# Patient Record
Sex: Male | Born: 1960 | Race: White | Hispanic: Yes | Marital: Single | Smoking: Never smoker
Health system: Southern US, Community
[De-identification: ages and names within clinical notes are randomized; demographics above are authoritative.]

## PROBLEM LIST (undated history)

## (undated) DIAGNOSIS — I10 Essential (primary) hypertension: Secondary | ICD-10-CM

## (undated) DIAGNOSIS — R42 Dizziness and giddiness: Secondary | ICD-10-CM

---

## 2008-07-02 ENCOUNTER — Emergency Department (HOSPITAL_COMMUNITY): Admission: EM | Admit: 2008-07-02 | Discharge: 2008-07-03 | Payer: Self-pay | Admitting: Emergency Medicine

## 2010-08-11 LAB — COMPREHENSIVE METABOLIC PANEL
AST: 20 U/L (ref 0–37)
Albumin: 3.8 g/dL (ref 3.5–5.2)
CO2: 28 mEq/L (ref 19–32)
Calcium: 8.9 mg/dL (ref 8.4–10.5)
Creatinine, Ser: 1.07 mg/dL (ref 0.4–1.5)
GFR calc Af Amer: >60 mL/min (ref >60)
GFR calc non Af Amer: >60 mL/min (ref >60)

## 2010-08-11 LAB — CBC
MCHC: 35.5 g/dL (ref 30.0–36.0)
MCV: 86.8 fL (ref 78.0–100.0)
Platelets: 262 10*3/uL (ref 150–400)

## 2010-08-11 LAB — URINALYSIS, ROUTINE W REFLEX MICROSCOPIC
Bilirubin Urine: NEGATIVE
Hgb urine dipstick: NEGATIVE
Ketones, ur: NEGATIVE mg/dL
Protein, ur: NEGATIVE mg/dL
Urobilinogen, UA: 0.2 mg/dL (ref 0.0–1.0)

## 2010-08-11 LAB — DIFFERENTIAL
Eosinophils Relative: 5 % (ref 0–5)
Lymphocytes Relative: 40 % (ref 12–46)
Lymphs Abs: 3.2 10*3/uL (ref 0.7–4.0)

## 2010-08-11 LAB — LIPASE, BLOOD: Lipase: 40 U/L (ref 11–59)

## 2012-07-14 ENCOUNTER — Emergency Department (HOSPITAL_COMMUNITY): Payer: Self-pay

## 2012-07-14 ENCOUNTER — Emergency Department (HOSPITAL_COMMUNITY)
Admission: EM | Admit: 2012-07-14 | Discharge: 2012-07-14 | Disposition: A | Discharge status: Home / Self Care | Payer: Self-pay | Attending: Emergency Medicine | Admitting: Emergency Medicine

## 2012-07-14 ENCOUNTER — Encounter (HOSPITAL_COMMUNITY): Payer: Self-pay | Admitting: Family Medicine

## 2012-07-14 DIAGNOSIS — J329 Chronic sinusitis, unspecified: Secondary | ICD-10-CM | POA: Insufficient documentation

## 2012-07-14 DIAGNOSIS — M542 Cervicalgia: Secondary | ICD-10-CM | POA: Insufficient documentation

## 2012-07-14 DIAGNOSIS — R059 Cough, unspecified: Secondary | ICD-10-CM | POA: Insufficient documentation

## 2012-07-14 DIAGNOSIS — J069 Acute upper respiratory infection, unspecified: Secondary | ICD-10-CM | POA: Insufficient documentation

## 2012-07-14 DIAGNOSIS — R51 Headache: Secondary | ICD-10-CM | POA: Insufficient documentation

## 2012-07-14 LAB — URINALYSIS, ROUTINE W REFLEX MICROSCOPIC
Bilirubin Urine: NEGATIVE
Glucose, UA: NEGATIVE mg/dL
Ketones, ur: 15 mg/dL — AB
Leukocytes, UA: NEGATIVE
Nitrite: NEGATIVE
Protein, ur: 30 mg/dL — AB
Specific Gravity, Urine: 1.027 (ref 1.005–1.030)
Urobilinogen, UA: 1 mg/dL (ref 0.0–1.0)
pH: 7 (ref 5.0–8.0)

## 2012-07-14 LAB — RAPID STREP SCREEN (MED CTR MEBANE ONLY): Streptococcus, Group A Screen (Direct): NEGATIVE

## 2012-07-14 LAB — URINE MICROSCOPIC-ADD ON

## 2012-07-14 MED ORDER — ACETAMINOPHEN 325 MG PO TABS
650.0000 mg | ORAL_TABLET | Freq: Four times a day (QID) | ORAL | Status: DC | PRN
  Administered 2012-07-14: 650 mg via ORAL
  Filled 2012-07-14: qty 2

## 2012-07-14 NOTE — ED Notes (Signed)
Per pt translator sts HA, fever since Friday. Denies N,V,D. sts also sore throat. Pt coughing at triage.

## 2012-07-14 NOTE — Discharge Instructions (Signed)
Gotas Nasales Salinas (Saline Nose Drops) Para ayudarlo a descongestionar la nariz, coloque gotas nasales de agua salada (solucin salina) en la nariz del beb. Esto ayuda a Interior and spatial designer. Utilice una jeringa de pera para limpiar la nariz:  Antes de alimentar al beb.  Antes de acostarlo para que tome una siesta.  No lo haga ms de una vez cada 3 horas para evitar que se irriten las fosas nasales. CUIDADOS EN EL HOGAR  Compre las gotas nasales en su farmacia local. Tambin puede hacer las gotas usted mismo. Mezcle 1 taza de agua con  cucharadita de sal. Revuelva. Almacene la mezcla a Publishing rights manager. Haga una nueva mezcla a diario.  Para utilizar las gotas:  Coloque 1 o 2 gotas en cada lado de la nariz del beb con un gotero medicinal limpio. No utilice este gotero para otros medicamentos.  Exprima el aire de la [place] de pera antes de introducirla en la nariz del beb.  Mientras mantiene la pera apretada, coloque la punta en una fosa nasal. Deje que el aire vuelva a entrar en la pera. La succin extraer el moco de la Clinical cytogeneticist e ir hacia la [place] de pera.  Repita en la otra fosa nasal.  Apriete la pera para extraer el contenido en un pauelo de papel y lave la punta en agua con jabn. Guarde la [place] de pera con el lado de la punta apoyado en papel absorbente.  Utilice la jeringa de pera slo con las gotas nasales para evitar que se irriten las fosas nasales del beb. SOLICITE AYUDA DE INMEDIATO SI:  El moco cambia a un color verde o amarillo.  El moco se vuelve ms espeso.  El beb tiene 3 meses o menos y su temperatura rectal es de 100.4 F (38 C) o mayor.  Su beb tiene ms de 3 meses y su temperatura rectal es de 102 F (38.9 C) o mayor.  La congestin nasal dura 10 das o ms.  El beb tiene problemas para respirar o para alimentarse. ASEGRESE DE QUE:  Comprende estas instrucciones.  Controlar su enfermedad.  Solicitar ayuda de inmediato si  el beb no est bien, o si empeora. Document Released: 05/20/2010 Document Revised: 07/10/2011 [facility] Patient Information 2013 [place], [place].  Infecciones virales (Viral Infections) La causa de las infecciones virales son diferentes tipos de virus.La mayora de las infecciones virales no son graves y se curan solas. Sin embargo, algunas infecciones pueden provocar sntomas graves y causar complicaciones.  SNTOMAS Las infecciones virales ocasionan:   Dolores de Advertising copywriter.  Molestias.  Dolor de [place].  Mucosidad nasal.  Diferentes tipos de erupcin.  Lagrimeo.  Cansancio.  Tos.  Prdida del apetito.  Infecciones gastrointestinales que producen nuseas, vmitos y [place]. Estos sntomas no responden a los antibiticos porque la infeccin no es por bacterias. Sin embargo, puede sufrir una infeccin bacteriana luego de la infeccin viral. Se denomina sobreinfeccin. Los sntomas de esta infeccin bacteriana son:   [employer] dolor en la garganta con pus y dificultad para tragar.  Ganglios hinchados en el cuello.  Escalofros y fiebre muy elevada o persistente.  Dolor de cabeza intenso.  Sensibilidad en los senos paranasales.  Malestar (sentirse enfermo) general persistente, dolores musculares y fatiga (cansancio).  Tos persistente.  Produccin mucosa con la tos, de color amarillo, verde o marrn. INSTRUCCIONES PARA EL CUIDADO DOMICILIARIO  Solo tome medicamentos que se pueden comprar sin receta o recetados para Chief Technology Officer, Dentist, la diarrea o la fiebre, como le [place]  mdico.  Beba gran cantidad de lquido para mantener la orina de tono claro o color amarillo plido. Las bebidas deportivas proporcionan electrolitos,azcares e hidratacin.  Descanse lo suficiente y [employer]. Puede tomar sopas y caldos con crackers o arroz. SOLICITE ATENCIN MDICA DE INMEDIATO SI:  Tiene dolor de cabeza, le falta el aire, siente dolor en el pecho, en el cuello o  aparece una erupcin.  Tiene vmitos o diarrea intensos y no puede retener lquidos.  Usted o su nio tienen una temperatura oral de ms de 102 F (38.9 C) y no puede controlarla con medicamentos.  Su beb tiene ms de 3 meses y su temperatura rectal es de 102 F (38.9 C) o ms.  Su beb tiene 3 meses o menos y su temperatura rectal es de 100.4 F (38 C) o ms. EST SEGURO QUE:   Comprende las instrucciones para el alta mdica.  Controlar su enfermedad.  Solicitar atencin mdica de inmediato segn las indicaciones. Document Released: 01/25/2005 Document Revised: 07/10/2011 [facility] Patient Information 2013 [place], [place].  Infeccin de las vas areas superiores en los adultos (Upper Respiratory Infection, Adult)  La infeccin respiratoria de las vas areas superiores se conoce tambin como resfro comn. Las vas areas superiores [employer] senos nasales, la garganta, la trquea, y los bronquios. Los bronquios son las vas areas que conducen el aire a los pulmones. La mayor parte de las personas mejora luego de una [place], Biomedical engineer los sntomas pueden durar [employer]. La tos residual puede durar ms. CAUSAS Varios tipos de virus pueden causar la infeccin de los tejidos que cubren las vas areas superiores. Los tejidos se irritan y se inflaman y se originan secreciones. Tambin es frecuente la produccin de moco. El resfro es contagioso. El virus se disemina fcilmente a otras personas por contacto oral. Aqu se incluyen los besos, el compartir un vaso y el toser o Engineering geologist. Tambin puede diseminarse tocndose la boca o la [place] y luego tocando una superficie que luego tocan Economist.  SNTOMAS Los sntomas se desarrollan entre uno y [place] luego de Cytogeneticist en contacto con el virus. Pueden variar de Neomia Dear persona a otra. Incluyen:  Secrecin nasal.  Estornudos  Congestin nasal.  Irritacin de los senos nasales.  Dolor de Advertising copywriter.  Prdida de la voz  (laringitis).  Tos.  Fatiga.  Dolores musculares.  Prdida del apetito.  Dolor de [place].  Fiebre no muy elevada. DIAGNSTICO Puede diagnosticarse a s mismo la infeccin respiratoria, segn los sntomas habituales, ya que la mayor parte de las personas se resfra dos o tres veces al ao. El profesional puede confirmarlo basndose en el examen fsico. Lo ms importante es que el profesional verifique que los sntomas no se deben a otra enfermedad como anginas, sinusitis, neumona, asma o epiglotitis. Para diagnosticar el resfrio comn, no es necesario que haga anlisis de [place], pruebas en la garganta o radiografas, pero en algunos casos puede ser de utilidad para excluir otros problemas ms graves. El mdico decidir si necesita otras pruebas. RIESGOS Y COMPLICACIONES Tendr mayor riesgo de sufrir un resfro grave si consume cigarrillos, sufre una enfermedad cardaca (como insuficiencia cardaca) o pulmonar crnica (como asma) o si tiene un debilitamiento del sistema inmunolgico. Las personas muy jvenes o muy mayores tienen riesgo de sufrir infecciones ms graves. La sinusitis bacteriana, las infecciones del odo medio y la neumona bacteriana pueden complicar el resfro comn. El resfro puede exacerbar el asma y la enfermedad pulmonar obstructiva crnica. En algunos casos estas  complicaciones requieren la atencin en un servicio de emergencias y pueden poner en peligro la vida. PREVENCIN La mejor manera de protegerse para no contraer un resfro es Pharmacologist una buena higiene. Evite el contacto bucal o de las manos con personas con sntomas de resfro. Si se produce el contacto, lvese las manos con frecuencia. No hay pruebas firmes que indiquen que la vitamina C, la vitamina E, la equincea o la actividad fsica reduzcan las posibilidades de tener una infeccin. Sin embargo, siempre se recomienda Insurance account manager y Winferd Humphrey buena nutricin. TRATAMIENTO El tratamiento est dirigido a  Consulting civil engineer sntomas. Esta enfermedad no tiene [place]. Los antibiticos no son eficaces, ya que esta infeccin la causa un virus y no una bacteria. El tratamiento incluye:  Aumente la ingesta de lquidos. Consumo de bebidas deportivas, que proporcionan electrolitos,azcares e hidratacin.  Inhale vapor caliente (de un vaporizador o de la ducha).  Tomar sopa de pollo u otros lquidos claros, y [employer] buena nutricin.  Descanse lo suficiente.  Haga grgaras o coma pastillas para [employer].  Control de la fiebre con ibuprofeno o acetaminofen, segn las indicaciones del mdico.  Aumento del uso del inhalador, si sufre asma. Las pastillas y los geles de zinc durante las primeras 24 horas de iniciado el resfro comn, pueden disminuir la duracin y Paramedic la gravedad de los sntomas. Los medicamentos para Chief Technology Officer pueden disminuir la fiebre, Paramedic los dolores musculares y Chief Technology Officer de Advertising copywriter. Se dispone de una gran variedad de medicamentos de venta libre para tratar la congestin y la secrecin nasal. El profesional podr recomendarle inhalantes para los otros sntomas. INSTRUCCIONES PARA EL CUIDADO DOMICILIARIO  Utilice los medicamentos de venta libre o de prescripcin para Chief Technology Officer, el malestar o la [place], segn se lo indique el profesional que lo asiste.  Utilice un vaporizador caliente o inhale vapor, haciendo salir agua de la ducha para aumentar la humedad [place]. Esto mantendr las secreciones hmedas y Community education officer ms fcil respirar.  Beba gran cantidad de lquido para mantener la orina de tono claro o color amarillo plido.  Descanse todo lo que pueda.  Regrese a su trabajo cuando la temperatura se haya normalizado, o cuando el profesional que lo asiste se lo indique. Quizs sea necesario que permanezca en su casa durante un tiempo ms prolongado para Buyer, retail a Economist. Tambin puede utilizar un barbijo y ser cuidadoso con el lavado de manos para  evitar la diseminacin del virus. SOLICITE ATENCIN MDICA SI:  Luego de los primeros das siente que empeora en vez de [place].  Necesita que Occupational psychologist brinde ms informacin relacionada con los medicamentos para [employer] sntomas.  Siente escalofros, le falta el aire o escupe moco de color marrn o rojo. Estos pueden ser sntomas de neumona.  Tiene una secrecin nasal de color amarillo o marrn, o siente dolor en el rostro, especialmente cuando se inclina hacia adelante. Estos pueden ser sntomas de sinusitis.  Tiene fiebre, siente el cuello hinchado, tiene dolor al tragar u observa manchas blancas en el fondo de la garganta. Estos pueden ser sntomas de angina por estreptococo. Romona Curls ATENCIN MDICA DE INMEDIATO SI:  Lance Muss.  Comienza a sentir Herbalist de cabeza intenso o persistente, dolor de odos, en el seno nasal o en el pecho.  Tiene tos y esta se prolonga demasiado, tose y escupe sangre, la mucosidad habitual se modifica (si tiene una enfermedad pulmonar crnica) o respira con dificultad.  Siente rigidez [employer]  cuello o dolor de cabeza intenso. Document Released: 01/25/2005 Document Revised: 07/10/2011 [facility] Patient Information 2013 [place], [place].  Sinusitis  (Sinusitis) La sinusitis es el enrojecimiento, dolor e hinchazn (inflamacin) de los senos paranasales. Los senos paranasales son bolsas de aire que se encuentran dentro de los [employer] del rostro (por debajo de los ojos, en la mitad de la frente o por encima de los ojos). En los senos paranasales sanos, el moco es capaz de Sales executive y el aire circula a travs de ellos en su camino hacia la Clinical cytogeneticist. Sin embargo, cuando se [place], el moco y el aire [place]. Esto hace que se desarrollen bacterias y otros grmenes y originen una infeccin.   La sinusitis puede desarrollarse rpidamente y durar slo un tiempo corto (aguda) o continuar por un perodo largo (crnica).. La sinusitis que dura ms de  12 semanas se considera crnica.  CAUSAS  Las causas de la sinusitis son:   [place].  Las [employer], como el desplazamiento del cartlago que separa las fosas nasales (desvo del tabique) pueden disminuir el flujo de aire por la nariz y los senos paranasales y Audiological scientist su drenaje.  Las alteraciones funcionales, como cuando los pequeos pelos (cilias) que se encuentran en los senos nasales y ayudan a eliminar la mucosidad no funcionan correctamente o no estn presentes. SNTOMAS  Los sntomas de sinusitis aguda y crnica son los mismos. Los sntomas principales son Chief Technology Officer y la presin alrededor de los senos paranasales afectados. Otros sntomas son:   Careers information officer.  Dolor de odos  Dolor de [place].  Mal aliento.  Disminucin del sentido del olfato y del gusto.  Tos, que empeora al [employer]  Fatiga.  Grant Ruts.  Drenaje de moco espeso por la nariz, que generalmente es de color verde y puede contener pus (purulento).  Hinchazn y calor en los senos paranasales afectados. DIAGNSTICO  El mdico le har un examen fsico. Durante el examen, el mdico:   Revisar su nariz buscando signos de crecimientos anormales en las fosas nasales (plipos nasales).  Palpar los senos paranasales afectados para buscar signos de infeccin.  Observar el interior de los senos paranasales (endoscopa) con un dispositivo luminoso especial (endoscopio) con el que tomar imgenes insertndolo en los senos paranasales. Si el mdico sospecha que usted sufre sinusitis crnica, podr indicar una o ms de las siguientes pruebas:   Pruebas de Programmer, multimedia.  Cultivo de secreciones nasales: tomar Lauris Poag del moco nasal y lo enviar a un laboratorio para detectar bacterias.  Citologa nasal: el mdico tomar [place] de moco de la nariz para determinar si la sinusitis que usted sufre est relacionada con Vella Raring. TRATAMIENTO  La mayora de los casos de sinusitis  aguda se deben a una infeccin viral y se resuelven espontneamente dentro de los [address]. En algunos casos se recetan medicamentos para [employer] (analgsicos, descongestivos, aerosoles nasales con corticoides o aerosoles salinos).  Sin embargo, para la sinusitis por infeccin bacteriana, Chief Operating Officer. Los antibiticos son medicamentos que destruyen las bacterias que causan la infeccin.  Rara vez la sinusitis tiene su origen en una infeccin por hongos. . En estos casos, el mdico le recetar un medicamento antifngico.  Para algunos casos de sinusitis crnica, es necesario someterse a [place]. Generalmente se trata de [employer] la sinusitis se repite ms de 3 veces al ao, a pesar de otros tratamientos.  INSTRUCCIONES PARA EL CUIDADO EN EL HOGAR   Beba gran  cantidad de lquidos. Los lquidos ayudan a Optometrist moco para que drene ms fcilmente de los senos paranasales.  Use un humidificador.  Inhale vapor de 3 a 4 veces al da (por ejemplo, sintese en el bao con la ducha abierta).  Aplique un pao tibio y hmedo en el rostro 3  4 veces al da, o segn las indicaciones de su mdico.  Use un aerosol nasal salino para ayudar a Environmental education officer y [employer] senos nasales.  Tome medicamentos de venta libre o recetados para Acupuncturist, Environmental health practitioner o la fiebre slo segn las indicaciones de su mdico. SOLICITE ATENCIN MDICA DE INMEDIATO SI:   Siente ms dolor o sufre dolores de cabeza intensos.  Tiene nuseas, vmitos o somnolencia.  Observa hinchazn alrededor del rostro.  Tiene problemas de visin.  Presenta rigidez en el cuello.  Tiene dificultad para respirar. ASEGRESE DE QUE:   Comprende estas instrucciones.  Controlar su enfermedad.  Solicitar ayuda de inmediato si no mejora o si empeora. Document Released: 01/25/2005 Document Revised: 07/10/2011 [facility] Patient Information 2013 [place], [place].

## 2012-07-14 NOTE — ED Notes (Signed)
Was able to obtain strep screen, patient did not tolerate well.

## 2012-07-14 NOTE — ED Provider Notes (Signed)
History     CSN: [id number]  Arrival date & time 07/14/12  1503   First MD Initiated Contact with Patient 07/14/12 1539      Chief Complaint  Patient presents with  . Fever    (Consider location/radiation/quality/duration/timing/severity/associated sxs/prior treatment) HPI Comments: 52 year old Spanish-speaking male presents to the emergency department complaining of sore throat, fever and headache x3 days.Tmax 104 yesterday. He took Advil which brought the fever and made his symptoms subsided. Admits to associated arthralgias and productive cough with green mucus. Headache located around his whole head described as throbbing. Admits to neck pain with coughing and a feeling of fullness. Denies chest pain, shortness of breath, nausea, vomiting, urinary changes, diarrhea or any sick contacts.  Patient is a 52 y.o. male presenting with fever. The history is provided by the patient. The history is limited by a language barrier. A language interpreter was used (phone interpreter).  Fever Associated symptoms: cough and headaches   Associated symptoms: no chest pain, no confusion, no dysuria, no nausea, no rash and no vomiting     History reviewed. No pertinent past medical history.  History reviewed. No pertinent past surgical history.  History reviewed. No pertinent family history.  History  Substance Use Topics  . Smoking status: Never Smoker   . Smokeless tobacco: Not on file  . Alcohol Use: No      Review of Systems  Constitutional: Positive for fever. Negative for appetite change.  HENT: Positive for neck pain. Negative for neck stiffness.   Respiratory: Positive for cough. Negative for shortness of breath.   Cardiovascular: Negative for chest pain.  Gastrointestinal: Negative for nausea and vomiting.  Genitourinary: Negative for dysuria, urgency and difficulty urinating.  Musculoskeletal: Negative for back pain.  Skin: Negative for rash.  Neurological: Positive for  headaches. Negative for dizziness, weakness and light-headedness.  Psychiatric/Behavioral: Negative for confusion.  All other systems reviewed and are negative.    Allergies  Review of patient's allergies indicates no known allergies.  Home Medications   Current Outpatient Rx  Name  Route  Sig  Dispense  Refill  . ibuprofen (ADVIL,MOTRIN) 200 MG tablet   Oral   Take 400 mg by mouth daily as needed for pain.           BP 157/100  Pulse 84  Temp(Src) 101.7 F (38.7 C)  Resp 18  SpO2 100%  Physical Exam  Nursing note and vitals reviewed. Constitutional: He is oriented to person, place, and time. He appears well-developed and well-nourished. No distress.  HENT:  Head: Normocephalic and atraumatic.  Nose: Mucosal edema present. Right sinus exhibits maxillary sinus tenderness and frontal sinus tenderness. Left sinus exhibits maxillary sinus tenderness and frontal sinus tenderness.  Mouth/Throat: Uvula is midline and mucous membranes are normal. Posterior oropharyngeal erythema present. No oropharyngeal exudate.  Post nasal drip present.  Eyes: Conjunctivae and EOM are normal. Pupils are equal, round, and reactive to light.  Neck: Normal range of motion. Neck supple. No Brudzinski's sign and no Kernig's sign noted.  Cardiovascular: Normal rate, regular rhythm, normal heart sounds and intact distal pulses.   Pulmonary/Chest: Effort normal and breath sounds normal. No respiratory distress. He has no decreased breath sounds. He has no wheezes. He has no rhonchi. He has no rales.  Abdominal: Soft. Bowel sounds are normal. There is no tenderness.  Musculoskeletal: Normal range of motion. He exhibits no edema.  Lymphadenopathy:    He has cervical adenopathy.  Neurological: He is alert and oriented to  person, place, and time.  Skin: Skin is warm. No rash noted.  Clammy  Psychiatric: He has a normal mood and affect. His behavior is normal.    ED Course  Procedures (including  critical care time)  Labs Reviewed  URINALYSIS, ROUTINE W REFLEX MICROSCOPIC - Abnormal; Notable for the following:    Hgb urine dipstick MODERATE (*)    Ketones, ur 15 (*)    Protein, ur 30 (*)    All other components within normal limits  RAPID STREP SCREEN  URINE CULTURE  URINE MICROSCOPIC-ADD ON   Dg Chest 2 View  07/14/2012  *RADIOLOGY REPORT*  Clinical Data: Nonproductive cough.  Bodyaches.  Sore throat.  CHEST - 2 VIEW  Comparison:  07/03/2008  Findings:  The heart size and mediastinal contours are within normal limits.  Both lungs are clear.  The visualized skeletal structures are unremarkable.  IMPRESSION: No active cardiopulmonary disease.   Original Report Authenticated By: Myles Rosenthal, M.D.      1. Viral URI   2. Sinusitis       MDM  52 year old male with viral URI and sinusitis. Chest x-ray clear. Rapid strep negative. Urine clear. He had negative meningeal signs. He is in no apparent distress. He'll be discharged with instructions to use nasal saline, Tylenol and Motrin, rest and increased fluids. This was discussed with patient via interpreter phone and patient states his understanding. Case discussed with Dr. Fonnie Jarvis who also evaluated patient and agrees with plan of care.        Trevor Mace, PA-C 07/14/12 1722

## 2012-07-14 NOTE — ED Provider Notes (Signed)
Medical screening examination/treatment/procedure(s) were conducted as a shared visit with non-physician practitioner(s) and myself.  I personally evaluated the patient during the encounter.  Lungs clear to auscultation unlabored, oropharynx noninjected, no stridor no drooling nares are patent, neck is supple, I doubt serious bacterial illness.  Hurman Horn, MD 07/15/12 1640

## 2012-07-14 NOTE — ED Notes (Signed)
Attempted to swab pt throat and unable to tolerate.

## 2012-07-16 LAB — URINE CULTURE

## 2013-08-06 ENCOUNTER — Ambulatory Visit
Admission: RE | Admit: 2013-08-06 | Discharge: 2013-08-06 | Disposition: A | Discharge status: Home / Self Care | Payer: PRIVATE HEALTH INSURANCE | Source: Ambulatory Visit | Attending: General Practice | Admitting: General Practice

## 2013-08-06 ENCOUNTER — Other Ambulatory Visit: Payer: Self-pay | Admitting: General Practice

## 2013-08-06 DIAGNOSIS — R42 Dizziness and giddiness: Secondary | ICD-10-CM

## 2013-12-02 ENCOUNTER — Observation Stay (HOSPITAL_COMMUNITY)
Admission: EM | Admit: 2013-12-02 | Discharge: 2013-12-03 | Disposition: A | Discharge status: Home / Self Care | Payer: PRIVATE HEALTH INSURANCE | Attending: Internal Medicine | Admitting: Internal Medicine

## 2013-12-02 ENCOUNTER — Emergency Department (HOSPITAL_COMMUNITY): Payer: PRIVATE HEALTH INSURANCE

## 2013-12-02 ENCOUNTER — Observation Stay (HOSPITAL_COMMUNITY): Payer: PRIVATE HEALTH INSURANCE

## 2013-12-02 ENCOUNTER — Encounter (HOSPITAL_COMMUNITY): Payer: Self-pay | Admitting: Emergency Medicine

## 2013-12-02 DIAGNOSIS — G454 Transient global amnesia: Secondary | ICD-10-CM | POA: Diagnosis not present

## 2013-12-02 DIAGNOSIS — I10 Essential (primary) hypertension: Secondary | ICD-10-CM | POA: Diagnosis present

## 2013-12-02 DIAGNOSIS — Z79899 Other long term (current) drug therapy: Secondary | ICD-10-CM | POA: Insufficient documentation

## 2013-12-02 DIAGNOSIS — Z7982 Long term (current) use of aspirin: Secondary | ICD-10-CM | POA: Diagnosis not present

## 2013-12-02 DIAGNOSIS — F29 Unspecified psychosis not due to a substance or known physiological condition: Secondary | ICD-10-CM | POA: Diagnosis present

## 2013-12-02 HISTORY — DX: Essential (primary) hypertension: I10

## 2013-12-02 LAB — COMPREHENSIVE METABOLIC PANEL
ALT: 28 U/L (ref 0–53)
AST: 25 U/L (ref 0–37)
Albumin: 4.3 g/dL (ref 3.5–5.2)
Alkaline Phosphatase: 76 U/L (ref 39–117)
Anion gap: 13 (ref 5–15)
BUN: 22 mg/dL (ref 6–23)
CALCIUM: 9.3 mg/dL (ref 8.4–10.5)
CHLORIDE: 101 meq/L (ref 96–112)
CO2: 24 mEq/L (ref 19–32)
Creatinine, Ser: 1.08 mg/dL (ref 0.50–1.35)
GFR calc Af Amer: 89 mL/min — ABNORMAL LOW (ref >90)
GFR, EST NON AFRICAN AMERICAN: 77 mL/min — AB (ref >90)
Glucose, Bld: 106 mg/dL — ABNORMAL HIGH (ref 70–99)
Potassium: 3.5 mEq/L — ABNORMAL LOW (ref 3.7–5.3)
SODIUM: 138 meq/L (ref 137–147)
Total Bilirubin: 0.3 mg/dL (ref 0.3–1.2)
Total Protein: 8.1 g/dL (ref 6.0–8.3)

## 2013-12-02 LAB — BASIC METABOLIC PANEL
Anion gap: 12 (ref 5–15)
BUN: 20 mg/dL (ref 6–23)
CO2: 26 meq/L (ref 19–32)
Calcium: 9.4 mg/dL (ref 8.4–10.5)
Chloride: 101 mEq/L (ref 96–112)
Creatinine, Ser: 0.97 mg/dL (ref 0.50–1.35)
GFR calc non Af Amer: >90 mL/min (ref >90)
Glucose, Bld: 85 mg/dL (ref 70–99)
POTASSIUM: 3.8 meq/L (ref 3.7–5.3)
Sodium: 139 mEq/L (ref 137–147)

## 2013-12-02 LAB — CBC
HCT: 46 % (ref 39.0–52.0)
Hemoglobin: 16.2 g/dL (ref 13.0–17.0)
MCH: 30.5 pg (ref 26.0–34.0)
MCHC: 35.2 g/dL (ref 30.0–36.0)
MCV: 86.6 fL (ref 78.0–100.0)
PLATELETS: 241 10*3/uL (ref 150–400)
RBC: 5.31 MIL/uL (ref 4.22–5.81)
RDW: 12.3 % (ref 11.5–15.5)
WBC: 10.1 10*3/uL (ref 4.0–10.5)

## 2013-12-02 LAB — PROTIME-INR
INR: 0.99 (ref 0.00–1.49)
PROTHROMBIN TIME: 13.1 s (ref 11.6–15.2)

## 2013-12-02 LAB — DIFFERENTIAL
BASOS ABS: 0 10*3/uL (ref 0.0–0.1)
Basophils Relative: 0 % (ref 0–1)
Eosinophils Absolute: 0.3 10*3/uL (ref 0.0–0.7)
Eosinophils Relative: 3 % (ref 0–5)
LYMPHS PCT: 29 % (ref 12–46)
Lymphs Abs: 3 10*3/uL (ref 0.7–4.0)
Monocytes Absolute: 1 10*3/uL (ref 0.1–1.0)
Monocytes Relative: 10 % (ref 3–12)
NEUTROS ABS: 5.9 10*3/uL (ref 1.7–7.7)
NEUTROS PCT: 58 % (ref 43–77)

## 2013-12-02 LAB — I-STAT TROPONIN, ED: Troponin i, poc: 0 ng/mL (ref 0.00–0.08)

## 2013-12-02 LAB — CBG MONITORING, ED: GLUCOSE-CAPILLARY: 110 mg/dL — AB (ref 70–99)

## 2013-12-02 LAB — TSH: TSH: 2.1 u[IU]/mL (ref 0.350–4.500)

## 2013-12-02 LAB — RAPID URINE DRUG SCREEN, HOSP PERFORMED
AMPHETAMINES: NOT DETECTED
BENZODIAZEPINES: NOT DETECTED
Barbiturates: NOT DETECTED
COCAINE: NOT DETECTED
OPIATES: NOT DETECTED
Tetrahydrocannabinol: NOT DETECTED

## 2013-12-02 LAB — VITAMIN B12: Vitamin B-12: 676 pg/mL (ref 211–911)

## 2013-12-02 LAB — APTT: aPTT: 29 seconds (ref 24–37)

## 2013-12-02 MED ORDER — LORAZEPAM 2 MG/ML IJ SOLN
2.0000 mg | Freq: Once | INTRAMUSCULAR | Status: AC
  Administered 2013-12-02: 2 mg via INTRAVENOUS

## 2013-12-02 MED ORDER — SODIUM CHLORIDE 0.9 % IV SOLN: 250.0000 mL | INTRAVENOUS | Status: DC | PRN

## 2013-12-02 MED ORDER — VALSARTAN-HYDROCHLOROTHIAZIDE 320-12.5 MG PO TABS: 1.0000 | ORAL_TABLET | Freq: Every day | ORAL | Status: DC

## 2013-12-02 MED ORDER — ASPIRIN EC 81 MG PO TBEC
81.0000 mg | DELAYED_RELEASE_TABLET | Freq: Every day | ORAL | Status: DC
  Administered 2013-12-02 – 2013-12-03 (×2): 81 mg via ORAL
  Filled 2013-12-02 (×2): qty 1

## 2013-12-02 MED ORDER — ASPIRIN 81 MG PO TBEC: 81.0000 mg | DELAYED_RELEASE_TABLET | Freq: Every day | ORAL | Status: DC

## 2013-12-02 MED ORDER — HYDROCHLOROTHIAZIDE 12.5 MG PO CAPS
12.5000 mg | ORAL_CAPSULE | Freq: Every day | ORAL | Status: DC
  Administered 2013-12-02 – 2013-12-03 (×2): 12.5 mg via ORAL
  Filled 2013-12-02 (×2): qty 1

## 2013-12-02 MED ORDER — SODIUM CHLORIDE 0.9 % IJ SOLN: 3.0000 mL | INTRAMUSCULAR | Status: DC | PRN

## 2013-12-02 MED ORDER — LORAZEPAM 2 MG/ML IJ SOLN
INTRAMUSCULAR | Status: AC
Start: 2013-12-02 — End: 2013-12-02
  Administered 2013-12-02: 09:00:00
  Filled 2013-12-02: qty 1

## 2013-12-02 MED ORDER — IRBESARTAN 300 MG PO TABS
300.0000 mg | ORAL_TABLET | Freq: Every day | ORAL | Status: DC
  Administered 2013-12-02 – 2013-12-03 (×2): 300 mg via ORAL
  Filled 2013-12-02 (×2): qty 1

## 2013-12-02 MED ORDER — SODIUM CHLORIDE 0.9 % IJ SOLN
3.0000 mL | Freq: Two times a day (BID) | INTRAMUSCULAR | Status: DC
  Administered 2013-12-02: 3 mL via INTRAVENOUS

## 2013-12-02 NOTE — Progress Notes (Deleted)
UR completed 

## 2013-12-02 NOTE — ED Notes (Signed)
Attempted report 

## 2013-12-02 NOTE — Consult Note (Signed)
Referring Physician: ED    Chief Complaint: CODE STROKE,  ACUTE ONSET MEMORY LOSS  HPI:                                                                                                                                         Brett Jones is an 53 y.o. male, right handed, with a past medical history significant for HTN, brought in via EMS due to acute onset memory loss. He is accompanied by his son who called EMS because his father went to take a shower and when came back from the bathroom he looked confused, unable to recall taking a shower or even having dinner last night. He tells me that "I don't recall anything at all".Never had such problem before. His son indicated that Mr. Brett Jones has been asking " what happened after the shower" and " what I was doing" over and over again. EMS reported no focal weakness, slurred speech. SBP 183/100. Upon arrival to the ED he was alert, answering questions, but disoriented about age and year. Denies HA, vertigo, double vision, focal weakness or numbness,slurred speech, language or vision impairment. No recent head trauma, fever, infection, or new medications. CT brain showed no acute intracranial abnormality. Available serologies unremarkable.  Date last known well: 12/01/13  Time last known well: 2330 tPA Given: no NIHSS: 4   Past Medical History  Diagnosis Date  . Hypertension     History reviewed. No pertinent past surgical history.  No family history on file. Social History:  reports that he has never smoked. He does not have any smokeless tobacco history on file. He reports that he does not drink alcohol or use illicit drugs.  Allergies: No Known Allergies  Medications:                                                                                                                           I have reviewed the patient's current medications.  ROS:  History obtained from chart review, son, and patient  General ROS: negative for - chills, fatigue, fever, night sweats, weight gain or weight loss Psychological ROS: negative for - behavioral disorder, hallucinations, memory difficulties, mood swings or suicidal ideation Ophthalmic ROS: negative for - blurry vision, double vision, eye pain or loss of vision ENT ROS: negative for - epistaxis, nasal discharge, oral lesions, sore throat, tinnitus or vertigo Allergy and Immunology ROS: negative for - hives or itchy/watery eyes Hematological and Lymphatic ROS: negative for - bleeding problems, bruising or swollen lymph nodes Endocrine ROS: negative for - galactorrhea, hair pattern changes, polydipsia/polyuria or temperature intolerance Respiratory ROS: negative for - cough, hemoptysis, shortness of breath or wheezing Cardiovascular ROS: negative for - chest pain, dyspnea on exertion, edema or irregular heartbeat Gastrointestinal ROS: negative for - abdominal pain, diarrhea, hematemesis, nausea/vomiting or stool incontinence Genito-Urinary ROS: negative for - dysuria, hematuria, incontinence or urinary frequency/urgency Musculoskeletal ROS: negative for - joint swelling or muscular weakness Neurological ROS: as noted in HPI Dermatological ROS: negative for rash and skin lesion changes  Physical exam: pleasant male in no apparent distress. Blood pressure 173/90, temperature 98.3 F (36.8 C), temperature source Oral, resp. rate 16, SpO2 100.00%. Head: normocephalic. Neck: supple, no bruits, no JVD. Cardiac: no murmurs. Lungs: clear. Abdomen: soft, no tender, no mass. Extremities: no edema. Neurologic Examination:                                                                                                      General: Mental Status: Alert, oriented, thought content appropriate.  Speech fluent without evidence of aphasia.  Able to follow 3  step commands without difficulty. Cranial Nerves: II: Discs flat bilaterally; Visual fields grossly normal, pupils equal, round, reactive to light and accommodation III,IV, VI: ptosis not present, extra-ocular motions intact bilaterally V,VII: smile symmetric, facial light touch sensation normal bilaterally VIII: hearing normal bilaterally IX,X: gag reflex present XI: bilateral shoulder shrug XII: midline tongue extension without atrophy or fasciculations  Motor: Right : Upper extremity   5/5    Left:     Upper extremity   5/5  Lower extremity   5/5     Lower extremity   5/5 Tone and bulk:normal tone throughout; no atrophy noted Sensory: Pinprick and light touch intact throughout, bilaterally Deep Tendon Reflexes:  Right: Upper Extremity   Left: Upper extremity   biceps (C-5 to C-6) 2/4   biceps (C-5 to C-6) 2/4 tricep (C7) 2/4    triceps (C7) 2/4 Brachioradialis (C6) 2/4  Brachioradialis (C6) 2/4  Lower Extremity Lower Extremity  quadriceps (L-2 to L-4) 2/4   quadriceps (L-2 to L-4) 2/4 Achilles (S1) 2/4   Achilles (S1) 2/4  Plantars: Right: downgoing   Left: downgoing Cerebellar: normal finger-to-nose,  normal heel-to-shin test Gait:  No tested    Results for orders placed during the hospital encounter of 12/02/13 (from the past 48 hour(s))  CBC     Status: None   Collection Time    12/02/13 12:49 AM      Result Value Ref Range  WBC 10.1  4.0 - 10.5 K/uL   RBC 5.31  4.22 - 5.81 MIL/uL   Hemoglobin 16.2  13.0 - 17.0 g/dL   HCT 60.446.0  54.039.0 - 98.152.0 %   MCV 86.6  78.0 - 100.0 fL   MCH 30.5  26.0 - 34.0 pg   MCHC 35.2  30.0 - 36.0 g/dL   RDW 19.112.3  47.811.5 - 29.515.5 %   Platelets 241  150 - 400 K/uL  DIFFERENTIAL     Status: None   Collection Time    12/02/13 12:49 AM      Result Value Ref Range   Neutrophils Relative % 58  43 - 77 %   Neutro Abs 5.9  1.7 - 7.7 K/uL   Lymphocytes Relative 29  12 - 46 %   Lymphs Abs 3.0  0.7 - 4.0 K/uL   Monocytes Relative 10  3 - 12 %    Monocytes Absolute 1.0  0.1 - 1.0 K/uL   Eosinophils Relative 3  0 - 5 %   Eosinophils Absolute 0.3  0.0 - 0.7 K/uL   Basophils Relative 0  0 - 1 %   Basophils Absolute 0.0  0.0 - 0.1 K/uL  I-STAT TROPOININ, ED     Status: None   Collection Time    12/02/13 12:54 AM      Result Value Ref Range   Troponin i, poc 0.00  0.00 - 0.08 ng/mL   Comment 3            Comment: Due to the release kinetics of cTnI,     a negative result within the first hours     of the onset of symptoms does not rule out     myocardial infarction with certainty.     If myocardial infarction is still suspected,     repeat the test at appropriate intervals.  CBG MONITORING, ED     Status: Abnormal   Collection Time    12/02/13  1:10 AM      Result Value Ref Range   Glucose-Capillary 110 (*) 70 - 99 mg/dL   No results found.    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 Patient's presentation highly suggestive of TGA. Doubt stroke involving let hypocampus or partial complex seizure. Admit to medicine and get MRI brain and EEG in the morning. Will follow up.   Wyatt Portelasvaldo  Camilo, MD [facility] [phone]  12/02/2013, 1:18 AM

## 2013-12-02 NOTE — Progress Notes (Signed)
This entire session was guided, instructed, and directly supervised by Ethelreda Sukhu B. Agnes Probert, PT, DPT #[phone]   

## 2013-12-02 NOTE — Progress Notes (Signed)
[facility] PROGRESS NOTE  Assessment/Plan: *Transient global amnesia - Appreciate neurology assistance. - Ct head 8.4.2015: ne hemorrhage or mass. - MRI on 8.4.2015: Minimal white matter type changes may reflect result of small vessel  disease in this hypertensive patient. - EEG on 8.4.2015: pending results. Check TSH - check UDS  Hypertension - mildly high, monitor.    Code Status: full Family Communication: none  Disposition Plan: inpatinet   Consultants:  neurology  Procedures:  MRI  CT head  EEG  Antibiotics:  None  HPI/Subjective: No complains  Objective: Filed Vitals:   12/02/13 0400 12/02/13 0430 12/02/13 0510 12/02/13 0615  BP: 123/70 120/70 143/71 149/84  Pulse: 54 51 52 51  Temp:   97.6 F (36.4 C) 97.5 F (36.4 C)  TempSrc:   Oral Oral  Resp: 16 15 16 18   Height:   5\' 7"  (1.702 m)   Weight:   81.647 kg (180 lb)   SpO2: 97% 97% 99% 100%   No intake or output data in the 24 hours ending 12/02/13 1103 Filed Weights   12/02/13 0510  Weight: 81.647 kg (180 lb)    Exam:  General: Alert, awake, oriented x3, in no acute distress.  HEENT: No bruits, no goiter.  Heart: Regular rate and rhythm, without murmurs, rubs, gallops.  Lungs: Good air movement, bilateral air movement.  Abdomen: Soft, nontender, nondistended, positive bowel sounds.  Neuro: Grossly intact, nonfocal.   Data Reviewed: Basic Metabolic Panel:  Recent Labs Lab 12/02/13 0049 12/02/13 0640  NA 138 139  K 3.5* 3.8  CL 101 101  CO2 24 26  GLUCOSE 106* 85  BUN 22 20  CREATININE 1.08 0.97  CALCIUM 9.3 9.4   Liver Function Tests:  Recent Labs Lab 12/02/13 0049  AST 25  ALT 28  ALKPHOS 76  BILITOT 0.3  PROT 8.1  ALBUMIN 4.3   No results found for this basename: LIPASE, AMYLASE,  in the last 168 hours No results found for this basename: AMMONIA,  in the last 168 hours CBC:  Recent Labs Lab 12/02/13 0049  WBC 10.1  NEUTROABS 5.9  HGB 16.2    HCT 46.0  MCV 86.6  PLT 241   Cardiac Enzymes: No results found for this basename: CKTOTAL, CKMB, CKMBINDEX, TROPONINI,  in the last 168 hours BNP (last 3 results) No results found for this basename: PROBNP,  in the last 8760 hours CBG:  Recent Labs Lab 12/02/13 0110  GLUCAP 110*    No results found for this or any previous visit (from the past 240 hour(s)).   Studies: Ct Head (brain) Wo Contrast  12/02/2013   CLINICAL DATA:  Code stroke.  EXAM: CT HEAD WITHOUT CONTRAST  TECHNIQUE: Contiguous axial images were obtained from the base of the skull through the vertex without intravenous contrast.  COMPARISON:  CT head 08/06/2013  FINDINGS: Ventricle size is normal. Negative for acute infarct. Negative for hemorrhage or mass. Calvarium intact.  IMPRESSION: No acute abnormality.  Critical Value/emergent results were called by telephone at the time of interpretation on 12/02/2013 at 1:25 am to Dr. Leroy Kennedyamilo, who verbally acknowledged these results.   Electronically Signed   By: Marlan Palauharles  Clark M.D.   On: 12/02/2013 01:25   Mr Brain Wo Contrast  12/02/2013   CLINICAL DATA:  53 year old hypertensive male with episode of confusion.  EXAM: MRI HEAD WITHOUT CONTRAST  TECHNIQUE: Multiplanar, multiecho pulse sequences of the brain and surrounding structures were obtained without intravenous contrast.  COMPARISON:  12/02/2013 head CT.  No comparison brain MR.  FINDINGS: Exam is motion degraded.  No acute infarct.  No intracranial hemorrhage.  No hydrocephalus.  Minimal nonspecific white matter type changes may be related to result of small vessel disease in this hypertensive patient.  Pituitary gland narrowed in a right to left direction with top-normal height but without mass identified.  No intracranial mass lesion noted on this unenhanced exam.  Cerebellar tonsils minimally low lying within the range of normal limits.  Major intracranial vascular structures are patent.  Orbital structures unremarkable.   IMPRESSION: No acute infarct.  Minimal white matter type changes may reflect result of small vessel disease in this hypertensive patient.   Electronically Signed   By: Bridgett Larsson M.D.   On: 12/02/2013 10:39    Scheduled Meds: . aspirin EC  81 mg Oral Daily  . hydrochlorothiazide  12.5 mg Oral Daily  . irbesartan  300 mg Oral Daily  . sodium chloride  3 mL Intravenous Q12H  . sodium chloride  3 mL Intravenous Q12H   Continuous Infusions:    Marinda Elk  [facility] Pager [phone]. If 8PM-8AM, please contact night-coverage at [email or web address], password [facility] 12/02/2013, 11:03 AM  LOS: 0 days      **Disclaimer: This note may have been dictated with voice recognition software. Similar sounding words can inadvertently be transcribed and this note may contain transcription errors which may not have been corrected upon publication of note.**

## 2013-12-02 NOTE — Procedures (Signed)
ELECTROENCEPHALOGRAM REPORT   Patient: Brett Jones       Room #: 4N19 EEG No. ID: [id number] Age: 53 y.o.        Sex: male Referring Physician: Robb Matarrtiz Report Date:  12/02/2013        Interpreting Physician: Thana FarrEYNOLDS,Ayham Word D  History: [name] is an 53 y.o. male with acute onset loss of memory  Medications:  Scheduled: . aspirin EC  81 mg Oral Daily  . hydrochlorothiazide  12.5 mg Oral Daily  . irbesartan  300 mg Oral Daily    Conditions of Recording:  This is a 16 channel EEG carried out with the patient in the drowsy and asleep states.  Description:  The patient does not attain full wakefulness during the recording therefore the waking background activity could not be evaluated.  The patient drowses briefly during the recording with the background activity consisting mostly of a mixture of poorly organized theta and delta activity.  Faster rhythms are intermixed on only rare occasions. The patient goes in to a light sleep with symmetrical sleep spindles, vertex central sharp transients and irregular slow activity.   Hyperventilation was not performed.  Intermittent photic stimulation was performed but failed to illicit any change in the tracing.    IMPRESSION: This is a normal drowsy and asleep electroencephalogram.  No epileptiform activity is noted.      Thana FarrLeslie Evagelia Knack, MD [facility] [phone] 12/02/2013, 12:24 PM

## 2013-12-02 NOTE — ED Notes (Signed)
Pt to ED via [facility] code stroke called en route.  Pt family reports pt getting in the shower around 2330 (LSN) - when pt got out of the shower he was asking repetitive questions and had loss of memory per family.  No physical deficit noted- speech normal.  Pt Spanish speaking only- pt son at bedside translating.  CBG 104, BP 190/119.  IV established.

## 2013-12-02 NOTE — Progress Notes (Signed)
UR completed 

## 2013-12-02 NOTE — ED Notes (Signed)
Call [facility] appx 5 min ago and cancelled Code Stroke per Dr. Leroy Kennedyamilo

## 2013-12-02 NOTE — ED Provider Notes (Addendum)
CSN: [id number]     Arrival date & time 12/02/13  0047 History   First MD Initiated Contact with Patient 12/02/13 [phone]     Chief complaint: Code stroke  (Consider location/radiation/quality/duration/timing/severity/associated sxs/prior Treatment) The history is provided by the patient and a relative. A language interpreter was used.   53 year old male got out of the shower at about 11:30 PM and was confused and asking repetitive questions. He was asking why bottle of water was there when he just purchased it earlier in the evening. He is back to normal. There is no history of trauma. At no point did he have any weakness or numbness. He denies that headache or chest pain or dyspnea. There is no nausea or vomiting. His only medical condition is hypertension.  No past medical history on file. No past surgical history on file. No family history on file. History  Substance Use Topics  . Smoking status: Never Smoker   . Smokeless tobacco: Not on file  . Alcohol Use: No    Review of Systems  All other systems reviewed and are negative.     Allergies  Review of patient's allergies indicates no known allergies.  Home Medications   Prior to Admission medications   Medication Sig Start Date End Date Taking? Authorizing Provider  ibuprofen (ADVIL,MOTRIN) 200 MG tablet Take 400 mg by mouth daily as needed for pain.    Historical Provider, MD   BP 180/100  Resp 15  SpO2 99% Physical Exam  Nursing note and vitals reviewed.  53 year old male, resting comfortably and in no acute distress. Vital signs are significant for hypertension with blood pressure 180/100. Oxygen saturation is 99%, which is normal. Head is normocephalic and atraumatic. PERRLA, EOMI. Oropharynx is clear. Fundi show no hemorrhage, exudate or papilledema. Neck is nontender and supple without adenopathy or JVD. There are no carotid bruits. Back is nontender and there is no CVA tenderness. Lungs are clear without rales,  wheezes, or rhonchi. Chest is nontender. Heart has regular rate and rhythm without murmur. Abdomen is soft, flat, nontender without masses or hepatosplenomegaly and peristalsis is normoactive. Extremities have no cyanosis or edema, full range of motion is present. Skin is warm and dry without rash. Neurologic: Mental status is normal, cranial nerves are intact, there are no motor or sensory deficits. There is no pronator drift.  ED Course  Procedures (including critical care time) Labs Review Results for orders placed during the hospital encounter of 12/02/13  [facility]      Result Value Ref Range   Prothrombin Time 13.1  11.6 - 15.2 seconds   INR 0.99  0.00 - 1.49  APTT      Result Value Ref Range   aPTT 29  24 - 37 seconds  CBC      Result Value Ref Range   WBC 10.1  4.0 - 10.5 K/uL   RBC 5.31  4.22 - 5.81 MIL/uL   Hemoglobin 16.2  13.0 - 17.0 g/dL   HCT 56.2  13.0 - 86.5 %   MCV 86.6  78.0 - 100.0 fL   MCH 30.5  26.0 - 34.0 pg   MCHC 35.2  30.0 - 36.0 g/dL   RDW 78.4  69.6 - 29.5 %   Platelets 241  150 - 400 K/uL  DIFFERENTIAL      Result Value Ref Range   Neutrophils Relative % 58  43 - 77 %   Neutro Abs 5.9  1.7 - 7.7 K/uL  Lymphocytes Relative 29  12 - 46 %   Lymphs Abs 3.0  0.7 - 4.0 K/uL   Monocytes Relative 10  3 - 12 %   Monocytes Absolute 1.0  0.1 - 1.0 K/uL   Eosinophils Relative 3  0 - 5 %   Eosinophils Absolute 0.3  0.0 - 0.7 K/uL   Basophils Relative 0  0 - 1 %   Basophils Absolute 0.0  0.0 - 0.1 K/uL  COMPREHENSIVE METABOLIC PANEL      Result Value Ref Range   Sodium 138  137 - 147 mEq/L   Potassium 3.5 (*) 3.7 - 5.3 mEq/L   Chloride 101  96 - 112 mEq/L   CO2 24  19 - 32 mEq/L   Glucose, Bld 106 (*) 70 - 99 mg/dL   BUN 22  6 - 23 mg/dL   Creatinine, Ser 3.53  0.50 - 1.35 mg/dL   Calcium 9.3  8.4 - 29.9 mg/dL   Total Protein 8.1  6.0 - 8.3 g/dL   Albumin 4.3  3.5 - 5.2 g/dL   AST 25  0 - 37 U/L   ALT 28  0 - 53 U/L   Alkaline Phosphatase 76   39 - 117 U/L   Total Bilirubin 0.3  0.3 - 1.2 mg/dL   GFR calc non Af Amer 77 (*) >90 mL/min   GFR calc Af Amer 89 (*) >90 mL/min   Anion gap 13  5 - 15  CBG MONITORING, ED      Result Value Ref Range   Glucose-Capillary 110 (*) 70 - 99 mg/dL  I-STAT TROPOININ, ED      Result Value Ref Range   Troponin i, poc 0.00  0.00 - 0.08 ng/mL   Comment 3            Imaging Review Ct Head (brain) Wo Contrast  12/02/2013   CLINICAL DATA:  Code stroke.  EXAM: CT HEAD WITHOUT CONTRAST  TECHNIQUE: Contiguous axial images were obtained from the base of the skull through the vertex without intravenous contrast.  COMPARISON:  CT head 08/06/2013  FINDINGS: Ventricle size is normal. Negative for acute infarct. Negative for hemorrhage or mass. Calvarium intact.  IMPRESSION: No acute abnormality.  Critical Value/emergent results were called by telephone at the time of interpretation on 12/02/2013 at 1:25 am to Dr. Leroy Kennedy, who verbally acknowledged these results.   Electronically Signed   By: Marlan Palau M.D.   On: 12/02/2013 01:25     EKG Interpretation   Date/Time:  Tuesday December 02 2013 01:05:11 EDT Ventricular Rate:  62 PR Interval:  144 QRS Duration: 98 QT Interval:  383 QTC Calculation: 389 R Axis:   38 Text Interpretation:  Sinus rhythm Left atrial enlargement Borderline T  abnormalities, inferior leads No old tracing to compare Confirmed by [facility]   MD, Revonda Menter ([id number]) on 12/02/2013 1:18:05 AM      CRITICAL CARE Performed by: TMHDQ,QIWLN Total critical care time: 35 minutes Critical care time was exclusive of separately billable procedures and treating other patients. Critical care was necessary to treat or prevent imminent or life-threatening deterioration. Critical care was time spent personally by me on the following activities: development of treatment plan with patient and/or surrogate as well as nursing, discussions with consultants, evaluation of patient's response to treatment,  examination of patient, obtaining history from patient or surrogate, ordering and performing treatments and interventions, ordering and review of laboratory studies, ordering and review of radiographic studies, pulse oximetry and  re-evaluation of patient's condition.  MDM   Final diagnoses:  Transient global amnesia  Essential hypertension    Transient global amnesia. No evidence of stroke. Workup is initiated and he will need to be admitted to get MRI scan in the morning.  Workup is significant only for borderline hypokalemia. He was seen in conjunction with neurohospitalist who recommended admission to medicine service to obtain an MRI scan in the morning. Case is discussed with Dr. Onalee Huaavid of [facility] who agrees to admit the patient under observation status.  Dione Boozeavid Maricarmen Braziel, MD 12/02/13 0401  Dione Boozeavid Welcome Fults, MD 12/02/13 [phone]

## 2013-12-02 NOTE — Code Documentation (Signed)
Code stroke called at  0036 for this Spanish speaking pt LSW at 2330 going to the shower.  Per son who provided translation pt came out from bathroom with loss of memory, unable to answer simple questions correctly and continued with repetitive questions.  No physical deficits noted. CBG 104  BP 190/113.   On arrival to ED at 0047 pt was cleared by Dr Ethelda ChickJacubowitz at [phone] for transport to CT. NIHSS 4. At end of NIHSS pt was able to correctly state his name, age, date.   Code cancelled by Dr Leroy Kennedyamilo at [phone]

## 2013-12-02 NOTE — Progress Notes (Signed)
Routine adult EEG completed. Results pending. 

## 2013-12-02 NOTE — H&P (Signed)
PCP:   Dennis BastABEZA,YURI, MD   Chief Complaint:  confusion  HPI: 53 yo male h/o htn was getting out of the shower tonight when family noted him to be confused and repeating the same question over and over.  They called 911.  No recent illnesses.  No n/v/d.  No fevers.  Pt back to normal now.  Denies any n/t/weakness anywhere.  No slurred speech.  There was no sz activity.  No facial drooping.  Review of Systems:  Positive and negative as per HPI otherwise all other systems are negative  Past Medical History: Past Medical History  Diagnosis Date  . Hypertension    History reviewed. No pertinent past surgical history.  Medications: Prior to Admission medications   Medication Sig Start Date End Date Taking? Authorizing Provider  valsartan-hydrochlorothiazide (DIOVAN-HCT) 320-12.5 MG per tablet Take 1 tablet by mouth daily.   Yes Historical Provider, MD    Allergies:  No Known Allergies  Social History:  reports that he has never smoked. He does not have any smokeless tobacco history on file. He reports that he does not drink alcohol or use illicit drugs.  Family History: none  Physical Exam: Filed Vitals:   12/02/13 0118 12/02/13 0200 12/02/13 0230 12/02/13 0330  BP: 173/90 167/98 144/95 134/81  Pulse:  60 64 58  Temp:      TempSrc:      Resp: 16 12 17 18   SpO2: 100% 98% 98% 97%   General appearance: alert, cooperative and no distress Head: Normocephalic, without obvious abnormality, atraumatic Eyes: negative Nose: Nares normal. Septum midline. Mucosa normal. No drainage or sinus tenderness. Neck: no JVD and supple, symmetrical, trachea midline Lungs: clear to auscultation bilaterally Heart: regular rate and rhythm, S1, S2 normal, no murmur, click, rub or gallop Abdomen: soft, non-tender; bowel sounds normal; no masses,  no organomegaly Extremities: extremities normal, atraumatic, no cyanosis or edema Pulses: 2+ and symmetric Skin: Skin color, texture, turgor normal. No  rashes or lesions Neurologic: Grossly normal    Labs on Admission:   Recent Labs  12/02/13 0049  NA 138  K 3.5*  CL 101  CO2 24  GLUCOSE 106*  BUN 22  CREATININE 1.08  CALCIUM 9.3    Recent Labs  12/02/13 0049  AST 25  ALT 28  ALKPHOS 76  BILITOT 0.3  PROT 8.1  ALBUMIN 4.3    Recent Labs  12/02/13 0049  WBC 10.1  NEUTROABS 5.9  HGB 16.2  HCT 46.0  MCV 86.6  PLT 241   Radiological Exams on Admission: Ct Head (brain) Wo Contrast  12/02/2013   CLINICAL DATA:  Code stroke.  EXAM: CT HEAD WITHOUT CONTRAST  TECHNIQUE: Contiguous axial images were obtained from the base of the skull through the vertex without intravenous contrast.  COMPARISON:  CT head 08/06/2013  FINDINGS: Ventricle size is normal. Negative for acute infarct. Negative for hemorrhage or mass. Calvarium intact.  IMPRESSION: No acute abnormality.  Critical Value/emergent results were called by telephone at the time of interpretation on 12/02/2013 at 1:25 am to Dr. Leroy Kennedyamilo, who verbally acknowledged these results.   Electronically Signed   By: Marlan Palauharles  Clark M.D.   On: 12/02/2013 01:25    Assessment/Plan  53 yo male with transient global amnesia/confusion  Principal Problem:   Transient global amnesia-  Code stroke was initiated.  Neuro following.  Ct head neg.  Symptoms resolved.  obs for mri and eeg per neuro recommendations.   Ob on tele bed.  Active Problems:  Hypertension  Stable cont home med.    Jaylynn Siefert A 12/02/2013, 4:13 AM

## 2013-12-02 NOTE — ED Notes (Signed)
Transporting patient to new room assignment. 

## 2013-12-02 NOTE — Discharge Summary (Signed)
Physician Discharge Summary  Kern ReapOscar Rodriguezluyando ZHY:[id number] DOB: [birth year] DOA: 12/02/2013  PCP: Dennis BastABEZA,YURI, MD  Admit date: 12/02/2013 Discharge date: 12/02/2013  Time spent: 35 minutes  Recommendations for Outpatient Follow-up:  1. Follow up with PCP in 2-4 weeks.  Discharge Diagnoses:  Principal Problem:   Transient global amnesia Active Problems:   Hypertension   Discharge Condition: stable  Diet recommendation: heart healthy  Filed Weights   12/02/13 0510  Weight: 81.647 kg (180 lb)    History of present illness:  53 yo male h/o htn was getting out of the shower tonight when family noted him to be confused and repeating the same question over and over. They called 911. No recent illnesses. No n/v/d. No fevers. Pt back to normal now. Denies any n/t/weakness anywhere. No slurred speech. There was no sz activity. No facial drooping.    Hospital Course:  Transient global amnesia  - Appreciate neurology assistance.  - Ct head 8.4.2015: no hemorrhage or mass.  - MRI on 8.4.2015: Minimal white matter type changes may reflect result of small vessel disease in this hypertensive patient.  - EEG on 8.4.2015: pending results. WNL TSH  - Negative UDS.    Hypertension  - mildly high, monitor.  Procedures:  MRI [facility]   Consultations:  neurology  Discharge Exam: Filed Vitals:   12/02/13 1300  BP: 145/73  Pulse: 56  Temp: 97.8 F (36.6 C)  Resp: 18    General: A&O x3 Cardiovascular: RRR Respiratory: good air movement CTA B/L  Discharge Instructions You were cared for by a hospitalist during your hospital stay. If you have any questions about your discharge medications or the care you received while you were in the hospital after you are discharged, you can call the unit and asked to speak with the hospitalist on call if the hospitalist that took care of you is not available. Once you are discharged, your primary care physician will handle any further medical  issues. Please note that NO REFILLS for any discharge medications will be authorized once you are discharged, as it is imperative that you return to your primary care physician (or establish a relationship with a primary care physician if you do not have one) for your aftercare needs so that they can reassess your need for medications and monitor your lab values.  Discharge Instructions   Diet - low sodium heart healthy    Complete by:  As directed      Increase activity slowly    Complete by:  As directed             Medication List         aspirin 81 MG EC tablet  Take 1 tablet (81 mg total) by mouth daily.     valsartan-hydrochlorothiazide 320-12.5 MG per tablet  Commonly known as:  DIOVAN-HCT  Take 1 tablet by mouth daily.       No Known Allergies     Follow-up Information   Follow up with CABEZA,YURI, MD In 3 weeks. (hospital follow up )    Specialty:  Internal Medicine   Contact information:   [address] [place] [address] [phone]        The results of significant diagnostics from this hospitalization (including imaging, microbiology, ancillary and laboratory) are listed below for reference.    Significant Diagnostic Studies: Ct Head (brain) Wo Contrast  12/02/2013   CLINICAL DATA:  Code stroke.  EXAM: CT HEAD WITHOUT CONTRAST  TECHNIQUE: Contiguous axial  images were obtained from the base of the skull through the vertex without intravenous contrast.  COMPARISON:  CT head 08/06/2013  FINDINGS: Ventricle size is normal. Negative for acute infarct. Negative for hemorrhage or mass. Calvarium intact.  IMPRESSION: No acute abnormality.  Critical Value/emergent results were called by telephone at the time of interpretation on 12/02/2013 at 1:25 am to Dr. Leroy Kennedy, who verbally acknowledged these results.   Electronically Signed   By: Marlan Palau M.D.   On: 12/02/2013 01:25   Mr Brain Wo Contrast  12/02/2013   CLINICAL DATA:  53 year old hypertensive  male with episode of confusion.  EXAM: MRI HEAD WITHOUT CONTRAST  TECHNIQUE: Multiplanar, multiecho pulse sequences of the brain and surrounding structures were obtained without intravenous contrast.  COMPARISON:  12/02/2013 head CT.  No comparison brain MR.  FINDINGS: Exam is motion degraded.  No acute infarct.  No intracranial hemorrhage.  No hydrocephalus.  Minimal nonspecific white matter type changes may be related to result of small vessel disease in this hypertensive patient.  Pituitary gland narrowed in a right to left direction with top-normal height but without mass identified.  No intracranial mass lesion noted on this unenhanced exam.  Cerebellar tonsils minimally low lying within the range of normal limits.  Major intracranial vascular structures are patent.  Orbital structures unremarkable.  IMPRESSION: No acute infarct.  Minimal white matter type changes may reflect result of small vessel disease in this hypertensive patient.   Electronically Signed   By: Bridgett Larsson M.D.   On: 12/02/2013 10:39    Microbiology: No results found for this or any previous visit (from the past 240 hour(s)).   Labs: Basic Metabolic Panel:  Recent Labs Lab 12/02/13 0049 12/02/13 0640  NA 138 139  K 3.5* 3.8  CL 101 101  CO2 24 26  GLUCOSE 106* 85  BUN 22 20  CREATININE 1.08 0.97  CALCIUM 9.3 9.4   Liver Function Tests:  Recent Labs Lab 12/02/13 0049  AST 25  ALT 28  ALKPHOS 76  BILITOT 0.3  PROT 8.1  ALBUMIN 4.3   No results found for this basename: LIPASE, AMYLASE,  in the last 168 hours No results found for this basename: AMMONIA,  in the last 168 hours CBC:  Recent Labs Lab 12/02/13 0049  WBC 10.1  NEUTROABS 5.9  HGB 16.2  HCT 46.0  MCV 86.6  PLT 241   Cardiac Enzymes: No results found for this basename: CKTOTAL, CKMB, CKMBINDEX, TROPONINI,  in the last 168 hours BNP: BNP (last 3 results) No results found for this basename: PROBNP,  in the last 8760  hours CBG:  Recent Labs Lab 12/02/13 0110  GLUCAP 110*      Signed:  FELIZ ORTIZ, TRUE [place]  [facility] 12/02/2013, 5:29 PM

## 2013-12-02 NOTE — Progress Notes (Signed)
Physical Therapy Evaluation Patient Details Name: Brett Jones MRN: [id number] DOB: 1961/02/01 Today's Date: 12/02/2013   History of Present Illness  53 y.o. M admitted 12/02/13 with AMS. Initial stroke workup pending however CT and MRI are both neg. PMHx of HTN.  Clinical Impression  Pt mobilized 29' with supervision for pt safety. Pt supervision/min guard for higher level balance activities though pt did not need PT assistance to maintain balance. PT to sign off as pt is at baseline level of functioning.    Follow Up Recommendations No PT follow up    Equipment Recommendations  None recommended by PT       Precautions / Restrictions Precautions Precautions: Fall Precaution Comments: Low fall risk and signed off to walk around room without assistance Restrictions Weight Bearing Restrictions: No      Mobility  Bed Mobility Overal bed mobility: Needs Assistance Bed Mobility: Supine to Sit     Supine to sit: Supervision     General bed mobility comments: Supervision for pt safety but pt did not need any assistance.  Transfers Overall transfer level: Needs assistance Equipment used: None Transfers: Sit to/from Stand Sit to Stand: Supervision         General transfer comment: Supervision for pt safety. Pt able to stand on own without assistance.  Ambulation/Gait Ambulation/Gait assistance: Supervision;Min guard Ambulation Distance (Feet): 525 Feet Assistive device: None Gait Pattern/deviations: WFL(Within Functional Limits);Step-through pattern   Gait velocity interpretation: at or above normal speed for age/gender General Gait Details: Pt amb at supervision assistance and was min guard assist when asked to perform higher level balance activities during gait.       Balance Overall balance assessment: Needs assistance Sitting-balance support: No upper extremity supported;Feet supported Sitting balance-Leahy Scale: Normal Sitting balance - Comments: Pt  able to maintain balance without UE support when sitting EOB.   Standing balance support: No upper extremity supported;During functional activity Standing balance-Leahy Scale: Good Standing balance comment: Pt is able to maintain balance in standing without UE and supervision assist for pt safety.             High level balance activites: Backward walking;Turns;Other (comment) (Increasing gait speed, picking up object from floor) High Level Balance Comments: Additionally pt simulated step over motion. Pt lost balance slightly during higher level balance activities however was able to maintain balance without support from PT.             Pertinent Vitals/Pain See vitals flow sheet.     Home Living Family/patient expects to be discharged to:: Private residence Living Arrangements: Alone Available Help at Discharge: Friend(s) Type of Home: House Home Access: Level entry     Home Layout: One level Home Equipment: None      Prior Function Level of Independence: Independent                  Extremity/Trunk Assessment               Lower Extremity Assessment: Overall WFL for tasks assessed (5/5 for L and R LE)         Communication   Communication: Interpreter utilized (Friend spoke [place])  Cognition Arousal/Alertness: Awake/alert Behavior During Therapy: WFL for tasks assessed/performed Overall Cognitive Status: Difficult to assess                               Assessment/Plan    PT Assessment Patent does not need any further  PT services  PT Diagnosis     PT Problem List    PT Treatment Interventions     PT Goals (Current goals can be found in the Care Plan section) Acute Rehab PT Goals PT Goal Formulation: With patient Time For Goal Achievement: 12/16/13 Potential to Achieve Goals: Good     End of Session Equipment Utilized During Treatment: Gait belt Activity Tolerance: Patient tolerated treatment well Patient left: in  chair;with call bell/phone within reach;with family/visitor present Nurse Communication: Mobility status;Precautions (Pt allowed to walk in room unassisted)    Functional Assessment Tool Used: assist level Functional Limitation: Mobility: Walking and moving around Mobility: Walking and Moving Around Current Status [phone]([phone]): At least 1 percent but less than 20 percent impaired, limited or restricted Mobility: Walking and Moving Around Goal Status [phone]([phone]): At least 1 percent but less than 20 percent impaired, limited or restricted Mobility: Walking and Moving Around Discharge Status [phone]([phone]): At least 1 percent but less than 20 percent impaired, limited or restricted    Time: [id number] PT Time Calculation (min): 19 min   Charges:   PT Evaluation $Initial PT Evaluation Tier I: 1 Procedure     PT G Codes:   Functional Assessment Tool Used: assist level Functional Limitation: Mobility: Walking and moving around    NuevoMackenzie Dusty Wagoner, [place] [phone]

## 2013-12-03 NOTE — Discharge Instructions (Signed)
Jari FavreOscar Rodriguezluyando was admitted to the Hospital on 12/02/2013 and Discharged on Discharge Date 12/03/2013 and should be excused from work/school   for 2   days starting 12/02/2013 , may return to work/school without any restrictions.  Call Lambert KetoAbraham Feliz MD, [facility] [phone] with questions.  Marinda ElkFELIZ ORTIZ, Azizah Lisle M.D on 12/03/2013,at 4:21 PM  [facility] Office  [phone]

## 2013-12-03 NOTE — Progress Notes (Signed)
Subjective: After discussing with mother, she states that he had several hours of repetitive questioning and problems with his memory. Once his symptoms resolved, however, his memory returned with the exception of about a twenty minute period  Exam: Filed Vitals:   12/03/13 0552  BP: 106/58  Pulse: 54  Temp: 98.1 F (36.7 C)  Resp: 16   Gen: In bed, NAD MS: Awake, alert, orientes to person, date, year, month, president.  NW:GNFACN:EOMI, PERRL Motor: MAEW Sensory:symmetric to LT  MRI  - no acute findings EEG - negative  Impression: 53 yo M with transient global amnesia. The prolonged anterograde amnesia with subsequent return of many of those memories is typical of this, but very unusual of other causes of amnesia(e.g. Seizures). With e typical history and negative workup with return to baseline, no further workup is necessary as this is usually a monophasic illness.   Recommendations: 1)No further workup is needed. Neurology to sign off.   Ritta SlotMcNeill Kirkpatrick, MD [facility] [phone]  If 7pm- 7am, please page neurology on call as listed in AMION.

## 2013-12-03 NOTE — Progress Notes (Signed)
Discharge instructions given. Pt verbalized understanding and all questions were answered.  

## 2014-10-06 ENCOUNTER — Encounter (HOSPITAL_COMMUNITY): Payer: Self-pay | Admitting: *Deleted

## 2014-10-06 ENCOUNTER — Emergency Department (HOSPITAL_COMMUNITY)
Admission: EM | Admit: 2014-10-06 | Discharge: 2014-10-06 | Disposition: A | Discharge status: Home / Self Care | Payer: PRIVATE HEALTH INSURANCE | Attending: Emergency Medicine | Admitting: Emergency Medicine

## 2014-10-06 ENCOUNTER — Emergency Department (HOSPITAL_COMMUNITY): Payer: PRIVATE HEALTH INSURANCE

## 2014-10-06 DIAGNOSIS — K5900 Constipation, unspecified: Secondary | ICD-10-CM | POA: Insufficient documentation

## 2014-10-06 DIAGNOSIS — M1611 Unilateral primary osteoarthritis, right hip: Secondary | ICD-10-CM | POA: Insufficient documentation

## 2014-10-06 DIAGNOSIS — Z79899 Other long term (current) drug therapy: Secondary | ICD-10-CM | POA: Insufficient documentation

## 2014-10-06 DIAGNOSIS — I10 Essential (primary) hypertension: Secondary | ICD-10-CM | POA: Insufficient documentation

## 2014-10-06 DIAGNOSIS — Z7982 Long term (current) use of aspirin: Secondary | ICD-10-CM | POA: Insufficient documentation

## 2014-10-06 DIAGNOSIS — M25552 Pain in left hip: Secondary | ICD-10-CM

## 2014-10-06 MED ORDER — CYCLOBENZAPRINE HCL 10 MG PO TABS: 10.0000 mg | ORAL_TABLET | Freq: Two times a day (BID) | ORAL | Status: DC | PRN

## 2014-10-06 MED ORDER — DICLOFENAC SODIUM 75 MG PO TBEC: 75.0000 mg | DELAYED_RELEASE_TABLET | Freq: Two times a day (BID) | ORAL | Status: DC

## 2014-10-06 NOTE — ED Notes (Signed)
Pt in c/o left hip pain that radiates into his left leg, no injury that he knows of, no redness or swelling, pain is worse with movement, no distress noted

## 2014-10-06 NOTE — ED Notes (Signed)
Pt A&OX4, ambulatory at d/c with steady gait, NAD, smiling and thanking staff for his care

## 2014-10-06 NOTE — ED Provider Notes (Signed)
CSN: [id number]     Arrival date & time 10/06/14  1503 History  This chart was scribed for non-physician practitioner, [facility]. Damian LeavellNeese, NP working with Elwin MochaBlair Walden, MD by Doreatha MartinEva Mathews, ED scribe. This patient was seen in room TR05C/TR05C and the patient's care was started at 4:25 PM    Chief Complaint  Patient presents with  . Leg Pain   Patient is a 54 y.o. male presenting with leg pain. The history is provided by the patient. No language interpreter was used.  Leg Pain Location:  Leg Time since incident:  3 weeks Injury: no   Leg location:  L leg Pain details:    Quality:  Unable to specify   Radiates to:  Does not radiate   Severity:  Moderate   Onset quality:  Gradual   Duration:  3 weeks   Timing:  Constant   Progression:  Unchanged Chronicity:  New Dislocation: no   Foreign body present:  No foreign bodies Exacerbated by: Worsened at night. Ineffective treatments:  None tried Associated symptoms: no numbness     HPI Comments: Brett Jones is a 54 y.o. male who presents to the Emergency Department complaining of moderate, constant left leg pain onset 3 weeks ago. Pt reports associated constipation. Pt states that pain is relieved by Tylenol and is exacerbated to a 9/10 at night. Pt states Hx of left-sided lower back pain that has since resolved. 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 He denies heavy lifting, injury or fall. He also denies CP, abdominal pain, numbness, dysuria, nausea, vomiting, and incontinence x2.   Past Medical History  Diagnosis Date  . Hypertension    History reviewed. No pertinent past surgical history. History reviewed. No pertinent family history. History  Substance Use Topics  . Smoking status: Never Smoker   . Smokeless tobacco: Not on file  . Alcohol Use: No    Review of Systems  Cardiovascular: Negative for chest pain.  Gastrointestinal: Positive for constipation. Negative for nausea, vomiting and abdominal  pain.  Genitourinary: Negative for dysuria.  Musculoskeletal: Positive for arthralgias.       Left hip and leg pain  Neurological: Negative for numbness.  All other systems reviewed and are negative.     Allergies  Review of patient's allergies indicates no known allergies.  Home Medications   Prior to Admission medications   Medication Sig Start Date End Date Taking? Authorizing Provider  aspirin EC 81 MG EC tablet Take 1 tablet (81 mg total) by mouth daily. 12/02/13   Marinda ElkAbraham Feliz Ortiz, MD  cyclobenzaprine (FLEXERIL) 10 MG tablet Take 1 tablet (10 mg total) by mouth 2 (two) times daily as needed for muscle spasms. 10/06/14   Eion Timbrook Orlene OchM [place], NP  diclofenac (VOLTAREN) 75 MG EC tablet Take 1 tablet (75 mg total) by mouth 2 (two) times daily. 10/06/14   Modest Draeger Orlene OchM Maksim Peregoy, NP  valsartan-hydrochlorothiazide (DIOVAN-HCT) 320-12.5 MG per tablet Take 1 tablet by mouth daily.    Historical Provider, MD   BP 157/89 mmHg  Pulse 66  Temp(Src) 98 F (36.7 C) (Oral)  Resp 18  Wt 182 lb (82.555 kg)  SpO2 100% Physical Exam  Constitutional: He is oriented to person, place, and time. He appears well-developed and well-nourished. No distress.  HENT:  Head: Normocephalic and atraumatic.  Mouth/Throat: Oropharynx is clear and moist.  Eyes: EOM are normal.  Neck: Normal range of motion. Neck supple. No tracheal deviation present.  Cardiovascular:  Normal rate.   Pulmonary/Chest: Effort normal.  Abdominal: Soft. There is no tenderness.  Musculoskeletal: He exhibits tenderness. He exhibits no edema.       Left hip: He exhibits tenderness. He exhibits normal strength and no deformity. Decreased range of motion: due to pain.       Legs: DP pulses 2+. Tender over sciatic nerve.   Neurological: He is alert and oriented to person, place, and time. He has normal strength. No cranial nerve deficit or sensory deficit. Gait normal.  Reflex Scores:      Patellar reflexes are 2+ on the right side and 2+ on the left  side.      Achilles reflexes are 2+ on the right side and 2+ on the left side. Skin: Skin is warm and dry.  Psychiatric: He has a normal mood and affect. His behavior is normal.  Nursing note and vitals reviewed.   ED Course  Procedures (including critical care time) DIAGNOSTIC STUDIES: Oxygen Saturation is 100% on RA, normal by my interpretation.    COORDINATION OF CARE: 4:31 PM Discussed treatment plan with pt at bedside which includes XR of the left hip and pt agreed to plan.  Dg Hip Unilat With Pelvis 2-3 Views Left  10/06/2014   CLINICAL DATA:  Left hip pain for 2 weeks with no known injury  EXAM: LEFT HIP (WITH PELVIS) 2-3 VIEWS  COMPARISON:  None.  FINDINGS: Study limited by patient body habitus. Mild to moderate axial hip joint space narrowing with no significant osteophyte formation. No acute osseous abnormalities detected.  IMPRESSION: Mildly limited study showing osteoarthritis.   Electronically Signed   By: Esperanza Heir M.D.   On: 10/06/2014 17:28     MDM  54 y.o. male with left hip pain after doing a lot of squatting at work. Stable for d/c without focal neuro deficits. Will treat for pain and inflammation. Patient to follow up with his PCP or return here as needed. Discussed with the patient and all questioned fully answered.   Final diagnoses:  Hip pain, left  Osteoarthritis of right hip, unspecified osteoarthritis type   I personally performed the services described in this documentation, which was scribed in my presence. The recorded information has been reviewed and is accurate.   [address] [place], [place] 10/08/14 2318  Elwin Mocha, MD 10/19/14 0700

## 2015-05-31 ENCOUNTER — Emergency Department (HOSPITAL_COMMUNITY)
Admission: EM | Admit: 2015-05-31 | Discharge: 2015-05-31 | Disposition: A | Discharge status: Home / Self Care | Payer: PRIVATE HEALTH INSURANCE | Attending: Emergency Medicine | Admitting: Emergency Medicine

## 2015-05-31 ENCOUNTER — Encounter (HOSPITAL_COMMUNITY): Payer: Self-pay | Admitting: Family Medicine

## 2015-05-31 DIAGNOSIS — I10 Essential (primary) hypertension: Secondary | ICD-10-CM | POA: Insufficient documentation

## 2015-05-31 DIAGNOSIS — Z79899 Other long term (current) drug therapy: Secondary | ICD-10-CM | POA: Insufficient documentation

## 2015-05-31 DIAGNOSIS — Z791 Long term (current) use of non-steroidal anti-inflammatories (NSAID): Secondary | ICD-10-CM | POA: Insufficient documentation

## 2015-05-31 LAB — CBC WITH DIFFERENTIAL/PLATELET
Basophils Absolute: 0 10*3/uL (ref 0.0–0.1)
Basophils Relative: 0 %
EOS PCT: 6 %
Eosinophils Absolute: 0.5 10*3/uL (ref 0.0–0.7)
HCT: 43.6 % (ref 39.0–52.0)
HEMOGLOBIN: 14.9 g/dL (ref 13.0–17.0)
LYMPHS ABS: 2.7 10*3/uL (ref 0.7–4.0)
LYMPHS PCT: 36 %
MCH: 29.2 pg (ref 26.0–34.0)
MCHC: 34.2 g/dL (ref 30.0–36.0)
MCV: 85.5 fL (ref 78.0–100.0)
Monocytes Absolute: 0.8 10*3/uL (ref 0.1–1.0)
Monocytes Relative: 10 %
NEUTROS PCT: 48 %
Neutro Abs: 3.4 10*3/uL (ref 1.7–7.7)
Platelets: 268 10*3/uL (ref 150–400)
RBC: 5.1 MIL/uL (ref 4.22–5.81)
RDW: 12.4 % (ref 11.5–15.5)
WBC: 7.3 10*3/uL (ref 4.0–10.5)

## 2015-05-31 LAB — BASIC METABOLIC PANEL
Anion gap: 11 (ref 5–15)
BUN: 13 mg/dL (ref 6–20)
CHLORIDE: 102 mmol/L (ref 101–111)
CO2: 27 mmol/L (ref 22–32)
Calcium: 9.5 mg/dL (ref 8.9–10.3)
Creatinine, Ser: 1.01 mg/dL (ref 0.61–1.24)
GFR calc Af Amer: >60 mL/min (ref >60)
GFR calc non Af Amer: >60 mL/min (ref >60)
GLUCOSE: 91 mg/dL (ref 65–99)
POTASSIUM: 3.9 mmol/L (ref 3.5–5.1)
Sodium: 140 mmol/L (ref 135–145)

## 2015-05-31 MED ORDER — HYDROCHLOROTHIAZIDE 12.5 MG PO TABS: 12.5000 mg | ORAL_TABLET | Freq: Every day | ORAL | Status: DC

## 2015-05-31 MED ORDER — AMLODIPINE BESYLATE 5 MG PO TABS
10.0000 mg | ORAL_TABLET | Freq: Once | ORAL | Status: AC
  Administered 2015-05-31: 10 mg via ORAL
  Filled 2015-05-31: qty 2

## 2015-05-31 NOTE — ED Notes (Signed)
Pt report that he has been taking his bp medications as prescribed but continues to have high blood pressure, also feels fatigue often.

## 2015-05-31 NOTE — ED Notes (Signed)
Pt here for headache that started at 4 am and hypertension. 169/99 at triage.

## 2015-05-31 NOTE — Discharge Instructions (Signed)
Hipertensin (Hypertension) La hipertensin, conocida comnmente como presin arterial alta, se produce cuando la sangre bombea en las arterias con mucha fuerza. Las arterias son los vasos sanguneos que transportan la sangre desde el corazn hacia todas las partes del cuerpo. Una lectura de la presin arterial consiste en un nmero ms alto sobre un nmero ms bajo, por ejemplo, 110/72. El nmero ms alto (presin sistlica) corresponde a la presin interna de las arterias cuando el corazn bombea sangre. El nmero ms bajo (presin diastlica) corresponde a la presin interna de las arterias cuando el corazn se relaja. En condiciones ideales, la presin arterial debe ser inferior a 120/80. La hipertensin fuerza al corazn a trabajar ms para bombear la sangre. Las arterias pueden estrecharse o ponerse rgidas. La hipertensin no tratada o no controlada puede causar infarto de miocardio, ictus, enfermedad renal y otros problemas. FACTORES DE RIESGO Algunos factores de riesgo de hipertensin son controlables, pero otros no lo son.  Entre los factores de riesgo que usted no puede controlar, se incluyen los siguientes:   La raza. El riesgo es mayor para las personas afroamericanas.  La edad. Los riesgos aumentan con la edad.  El sexo. Antes de los 45aos, los hombres corren ms riesgo que las mujeres. Despus de los 65aos, las mujeres corren ms riesgo que los hombres. Entre los factores de riesgo que usted puede controlar, se incluyen los siguientes:  No hacer la cantidad suficiente de actividad fsica o ejercicio.  Tener sobrepeso.  Consumir mucha grasa, azcar, caloras o sal en la dieta.  Beber alcohol en exceso. SIGNOS Y SNTOMAS Por lo general, la hipertensin no causa signos o sntomas. La hipertensin arterial demasiado alta (crisis hipertensiva) puede causar dolor de cabeza, ansiedad, falta de aire y hemorragia nasal. DIAGNSTICO Para detectar si usted tiene hipertensin, el  mdico le medir la presin arterial mientras est sentado, con el brazo levantado a la altura del corazn. Debe medirla al menos dos veces en el mismo brazo. Determinadas condiciones pueden causar una diferencia de presin arterial entre el brazo izquierdo y el derecho. El hecho de tener una sola lectura de la presin arterial ms alta que lo normal no significa que necesita un tratamiento. Si no est claro si tiene hipertensin arterial, es posible que se le pida que regrese otro da para volver a controlarle la presin arterial. O bien se le puede pedir que se controle la presin arterial en su casa durante 1 o ms meses. TRATAMIENTO El tratamiento de la hipertensin arterial incluye hacer cambios en el estilo de vida y, posiblemente, tomar medicamentos. Un estilo de vida saludable puede ayudar a bajar la presin arterial alta. Quiz deba cambiar algunos hbitos. Los cambios en el estilo de vida pueden incluir lo siguiente:  Seguir la dieta DASH. Esta dieta tiene un alto contenido de frutas, verduras y cereales integrales. Incluye poca cantidad de sal, carnes rojas y azcares agregados.  Mantenga el consumo de sodio por debajo de 2300 mg por da.  Realizar al menos entre 30 y 45 minutos de ejercicio aerbico, 4 veces por semana como mnimo.  Perder peso, si es necesario.  No fumar.  Limitar el consumo de bebidas alcohlicas.  Aprender formas de reducir el estrs. El mdico puede recetarle medicamentos si los cambios en el estilo de vida no son suficientes para lograr controlar la presin arterial y si una de las siguientes afirmaciones es verdadera:  Tiene entre 18 y 59 aos y su presin arterial sistlica est por encima de 140.  Tiene   60 aos o ms y su presin arterial sistlica est por encima de 150.  Su presin arterial diastlica est por encima de 90.  Tiene diabetes y su presin arterial sistlica est por encima de 140 o su presin arterial diastlica est por encima de  90.  Tiene una enfermedad renal y su presin arterial est por encima de 140/90.  Tiene una enfermedad cardaca y su presin arterial est por encima de 140/90. La presin arterial deseada puede variar en funcin de las enfermedades, la edad y otros factores personales. INSTRUCCIONES PARA EL CUIDADO EN EL HOGAR  Haga que le midan de nuevo la presin arterial segn las indicaciones del mdico.  Tome los medicamentos solamente como se lo haya indicado el mdico. Siga cuidadosamente las indicaciones. Los medicamentos para la presin arterial deben tomarse segn las indicaciones. Los medicamentos pierden eficacia al omitir las dosis. El hecho de omitir las dosis tambin aumenta el riesgo de otros problemas.  No fume.  Contrlese la presin arterial en su casa segn las indicaciones del mdico. SOLICITE ATENCIN MDICA SI:   Piensa que tiene una reaccin alrgica a los medicamentos.  Tiene mareos o dolores de cabeza con recurrencia.  Tiene hinchazn en los tobillos.  Tiene problemas de visin. SOLICITE ATENCIN MDICA DE INMEDIATO SI:  Siente un dolor de cabeza intenso o confusin.  Siente debilidad inusual, adormecimiento o que se desmayar.  Siente dolor intenso en el pecho o en el abdomen.  Vomita repetidas veces.  Tiene dificultad para respirar. ASEGRESE DE QUE:   Comprende estas instrucciones.  Controlar su afeccin.  Recibir ayuda de inmediato si no mejora o si empeora.   Esta informacin no tiene como fin reemplazar el consejo del mdico. Asegrese de hacerle al mdico cualquier pregunta que tenga.   Document Released: 04/17/2005 Document Revised: 09/01/2014 Elsevier Interactive Patient Education 2016 Elsevier Inc.  

## 2015-06-01 NOTE — ED Provider Notes (Signed)
CSN: [id number]     Arrival date & time 05/31/15  1332 History   First MD Initiated Contact with Patient 05/31/15 1652     Chief Complaint  Patient presents with  . Headache  . Hypertension     HPI  She presents for evaluation with his mother. He states that he's been taking his blood pressure medicine as prescribed. He had several high blood pressure readings over the last few weeks. His mothers become concerned. He often feels fatigued. Denies that he has recurrent problems with headaches. No chest pain shortness of breath no leg edema. Was on Diovan hydrochlorothiazide for quite some time. Started on Norvasc several months ago. Is on 10 mg. Is also on max dose Diovan although only 12.5 mg HCT. No leg edema. Orthostasis. He presents for evaluation.  Past Medical History  Diagnosis Date  . Hypertension    History reviewed. No pertinent past surgical history. History reviewed. No pertinent family history. Social History  Substance Use Topics  . Smoking status: Never Smoker   . Smokeless tobacco: None  . Alcohol Use: No    Review of Systems  Constitutional: Negative for fever, chills, diaphoresis, appetite change and fatigue.  HENT: Negative for mouth sores, sore throat and trouble swallowing.   Eyes: Negative for visual disturbance.  Respiratory: Negative for cough, chest tightness, shortness of breath and wheezing.   Cardiovascular: Negative for chest pain.  Gastrointestinal: Negative for nausea, vomiting, abdominal pain, diarrhea and abdominal distention.  Endocrine: Negative for polydipsia, polyphagia and polyuria.  Genitourinary: Negative for dysuria, frequency and hematuria.  Musculoskeletal: Negative for gait problem.  Skin: Negative for color change, pallor and rash.  Neurological: Positive for dizziness. Negative for syncope, light-headedness and headaches.  Hematological: Does not bruise/bleed easily.  Psychiatric/Behavioral: Negative for behavioral problems and  confusion.      Allergies  Review of patient's allergies indicates no known allergies.  Home Medications   Prior to Admission medications   Medication Sig Start Date End Date Taking? Authorizing Provider  amLODipine (NORVASC) 10 MG tablet Take 10 mg by mouth daily. Patient and family member states he is taking this medication   Yes Historical Provider, MD  diclofenac (CATAFLAM) 50 MG tablet Take 50 mg by mouth 2 (two) times daily.   Yes Historical Provider, MD  valsartan-hydrochlorothiazide (DIOVAN-HCT) 320-12.5 MG per tablet Take 1 tablet by mouth daily.   Yes Historical Provider, MD  aspirin EC 81 MG EC tablet Take 1 tablet (81 mg total) by mouth daily. Patient not taking: Reported on 05/31/2015 12/02/13   Marinda Elk, MD  cyclobenzaprine (FLEXERIL) 10 MG tablet Take 1 tablet (10 mg total) by mouth 2 (two) times daily as needed for muscle spasms. Patient not taking: Reported on 05/31/2015 10/06/14   Janne Napoleon, NP  diclofenac (VOLTAREN) 75 MG EC tablet Take 1 tablet (75 mg total) by mouth 2 (two) times daily. Patient not taking: Reported on 05/31/2015 10/06/14   Janne Napoleon, NP  hydrochlorothiazide (HYDRODIURIL) 12.5 MG tablet Take 1 tablet (12.5 mg total) by mouth daily. 05/31/15   Rolland Porter, MD   BP 136/88 mmHg  Pulse 55  Temp(Src) 98.4 F (36.9 C) (Oral)  Resp 16  SpO2 99% Physical Exam  Constitutional: He is oriented to person, place, and time. He appears well-developed and well-nourished. No distress.  HENT:  Head: Normocephalic.  Eyes: Conjunctivae are normal. Pupils are equal, round, and reactive to light. No scleral icterus.  Neck: Normal range of motion. Neck  supple. No thyromegaly present.  Cardiovascular: Normal rate and regular rhythm.  Exam reveals no gallop and no friction rub.   No murmur heard. Pulmonary/Chest: Effort normal and breath sounds normal. No respiratory distress. He has no wheezes. He has no rales.  Abdominal: Soft. Bowel sounds are normal. He  exhibits no distension. There is no tenderness. There is no rebound.  Musculoskeletal: Normal range of motion.  Neurological: He is alert and oriented to person, place, and time.  Skin: Skin is warm and dry. No rash noted.  Psychiatric: He has a normal mood and affect. His behavior is normal.   lungs. No leg edema. Denies headache. Normal cranial nerve and neurological exam.  ED Course  Procedures (including critical care time) Labs Review Labs Reviewed  CBC WITH DIFFERENTIAL/PLATELET  BASIC METABOLIC PANEL    Imaging Review No results found. I have personally reviewed and evaluated these images and lab results as part of my medical decision-making.   EKG Interpretation None      MDM   Final diagnoses:  Essential hypertension    Normal renal function. I think is appropriate for outpatient treatment. We'll increase his morning hydrochlorothiazide. Recommend primary care follow-up. Stop smoking.    Rolland Porter, MD 06/01/15 [phone]

## 2015-09-20 ENCOUNTER — Emergency Department (HOSPITAL_COMMUNITY)
Admission: EM | Admit: 2015-09-20 | Discharge: 2015-09-21 | Disposition: A | Discharge status: Home / Self Care | Payer: PRIVATE HEALTH INSURANCE | Attending: Emergency Medicine | Admitting: Emergency Medicine

## 2015-09-20 ENCOUNTER — Encounter (HOSPITAL_COMMUNITY): Payer: Self-pay | Admitting: Emergency Medicine

## 2015-09-20 DIAGNOSIS — I10 Essential (primary) hypertension: Secondary | ICD-10-CM | POA: Insufficient documentation

## 2015-09-20 DIAGNOSIS — H8110 Benign paroxysmal vertigo, unspecified ear: Secondary | ICD-10-CM

## 2015-09-20 NOTE — ED Notes (Signed)
Pt called to be taken back to room, no answer

## 2015-09-20 NOTE — ED Notes (Signed)
Pt reports hypertension x 2 days with associated dizziness. Pt alert x4. NAD this time.

## 2015-09-21 MED ORDER — MECLIZINE HCL 25 MG PO TABS: 25.0000 mg | ORAL_TABLET | Freq: Three times a day (TID) | ORAL | Status: DC | PRN

## 2015-09-21 NOTE — Discharge Instructions (Signed)
Stop the hydrochlorothiazide. Start meclezine. See your doctor to see if they can given you alternate BP medicine. Do the exercises we provided.  Please return to the ER if your symptoms worsen; you have new numbness, tingling, weakness or constant dizziness, fainting.    Vrtigo posicional benigno (Benign Positional Vertigo) El vrtigo es la sensacin de que usted o todo lo que lo rodea se mueven cuando en realidad eso no sucede. El vrtigo posicional benigno es el tipo de vrtigo ms comn. La causa de este trastorno no es grave (es benigna). Algunos movimientos y determinadas posiciones pueden desencadenar el trastorno (es posicional). El vrtigo posicional benigno puede ser peligroso si ocurre mientras est haciendo algo que podra suponer un riesgo para usted y para los dems, por ejemplo, conduciendo un automvil.  CAUSAS En muchos de los [place], se desconoce la causa de este trastorno. Puede deberse a Hotel manager zona del odo interno que ayuda al cerebro a percibir el movimiento y a Administrator, Civil Service equilibrio. Esta alteracin puede deberse a una infeccin viral (laberintitis), a una lesin en la cabeza o a los movimientos reiterados. FACTORES DE RIESGO Es ms probable que esta afeccin se manifieste en:  Las mujeres.  Las [employer] de [id number]. SNTOMAS Generalmente, los sntomas de este trastorno se presentan al mover la cabeza o los ojos en diferentes direcciones. Pueden aparecer repentinamente y suelen durar menos de un minuto. Entre los sntomas se pueden incluir los siguientes:  Prdida del equilibrio y cadas.  Sensacin de estar dando vueltas o movindose.  Sensacin de que el entorno est dando vueltas o movindose.  Nuseas y vmitos.  Visin borrosa.  Mareos.  Movimientos oculares involuntarios (nistagmo). Los sntomas pueden ser leves y algo fastidiosos, o pueden ser graves e interferir en la vida cotidiana. Los episodios de vrtigo posicional  benigno pueden repetirse (ser recurrentes) a lo largo del [place], y algunos movimientos pueden desencadenarlos. Los sntomas pueden mejorar con Museum/gallery conservator. DIAGNSTICO Generalmente, este trastorno se diagnostica con una historia clnica y un examen fsico de la cabeza, el cuello y los odos. Tal vez lo deriven a Catering manager en problemas de la garganta, la nariz y el odo (otorrinolaringlogo), o a uno que se especializa en trastornos del sistema nervioso (neurlogo). Pueden hacerle otros estudios, entre ellos:  Health visitor.  Tomografa computarizada.  Estudios de los [employer]. El mdico puede pedirle que cambie rpidamente de posicin mientras observa si se presentan sntomas de vrtigo posicional benigno, por ejemplo, nistagmo. Los movimientos oculares se pueden estudiar con una electronistagmografa (ENG), con estimulacin trmica, mediante la maniobra de [place] o con la prueba de rotacin.  Electroencefalograma (EEG). Este estudio registra la actividad elctrica del cerebro.  Pruebas de audicin. Lissa Morales, para tratar este trastorno, el mdico le har movimientos especficos con la cabeza para que el odo interno se normalice. [employer] casos son graves, tal vez haya que realizar una ciruga, pero esto no es frecuente. En algunos casos, el vrtigo posicional benigno se resuelve por s solo en el trmino de 2 o 4semanas. INSTRUCCIONES PARA EL CUIDADO EN EL HOGAR Seguridad  Muvase lentamente.No haga movimientos bruscos con el cuerpo o con la cabeza.  No conduzca.  No opere maquinaria pesada.  No haga ninguna tarea que podra ser peligrosa para usted o para Economist en caso de que ocurriera un episodio de vrtigo.  Si tiene dificultad para caminar o mantener el equilibrio, use un bastn para Photographer estabilidad. Si se siente mareado  o inestable, sintese de inmediato.  Reanude sus actividades normales como se lo haya  indicado el mdico. Pregntele al mdico qu actividades son seguras para usted. Instrucciones generales  Baxter Internationalome los medicamentos de venta libre y los recetados solamente como se lo haya indicado el mdico.  Evite algunas posiciones o determinados movimientos como se lo haya indicado el mdico.  Beba suficiente lquido para Pharmacologistmantener la orina clara o de color amarillo plido.  Concurra a todas las visitas de control como se lo haya indicado el mdico. Esto es importante. SOLICITE ATENCIN MDICA SI:  Lance Mussiene fiebre.  El trastorno [place]empeora, o le aparecen sntomas nuevos.  Sus familiares o amigos advierten cambios en su comportamiento.  Las nuseas o los vmitos empeoran.  Tiene sensacin de hormigueo o de adormecimiento. SOLICITE ATENCIN MDICA DE INMEDIATO SI:  Tiene dificultad para hablar o para moverse.  Esta mareado todo Allied Waste Industriesel tiempo.  Se desmaya.  Tiene dolores de cabeza intensos.  Tiene debilidad en los brazos o las piernas.  Tiene cambios en la audicin o la visin.  Siente rigidez en el cuello.  Tiene sensibilidad a Statisticianla luz.   Esta informacin no tiene Theme park managercomo fin reemplazar el consejo del mdico. Asegrese de hacerle al mdico cualquier pregunta que tenga.   Document Released: 08/03/2008 Document Revised: 01/06/2015 Elsevier Interactive Patient Education [employer]2016 Elsevier Inc.

## 2016-01-20 ENCOUNTER — Emergency Department (HOSPITAL_COMMUNITY)
Admission: EM | Admit: 2016-01-20 | Discharge: 2016-01-20 | Disposition: A | Discharge status: Home / Self Care | Payer: Self-pay | Attending: Emergency Medicine | Admitting: Emergency Medicine

## 2016-01-20 ENCOUNTER — Encounter (HOSPITAL_COMMUNITY): Payer: Self-pay | Admitting: Emergency Medicine

## 2016-01-20 ENCOUNTER — Emergency Department (HOSPITAL_COMMUNITY): Payer: Self-pay

## 2016-01-20 DIAGNOSIS — R0789 Other chest pain: Secondary | ICD-10-CM | POA: Insufficient documentation

## 2016-01-20 DIAGNOSIS — Z79899 Other long term (current) drug therapy: Secondary | ICD-10-CM | POA: Insufficient documentation

## 2016-01-20 DIAGNOSIS — Z7982 Long term (current) use of aspirin: Secondary | ICD-10-CM | POA: Insufficient documentation

## 2016-01-20 DIAGNOSIS — R51 Headache: Secondary | ICD-10-CM | POA: Insufficient documentation

## 2016-01-20 DIAGNOSIS — I10 Essential (primary) hypertension: Secondary | ICD-10-CM | POA: Insufficient documentation

## 2016-01-20 DIAGNOSIS — M25512 Pain in left shoulder: Secondary | ICD-10-CM | POA: Insufficient documentation

## 2016-01-20 DIAGNOSIS — R519 Headache, unspecified: Secondary | ICD-10-CM

## 2016-01-20 LAB — BASIC METABOLIC PANEL
Anion gap: 12 (ref 5–15)
BUN: 19 mg/dL (ref 6–20)
CO2: 24 mmol/L (ref 22–32)
CREATININE: 1.02 mg/dL (ref 0.61–1.24)
Calcium: 9.2 mg/dL (ref 8.9–10.3)
Chloride: 103 mmol/L (ref 101–111)
Glucose, Bld: 102 mg/dL — ABNORMAL HIGH (ref 65–99)
Potassium: 3.8 mmol/L (ref 3.5–5.1)
SODIUM: 139 mmol/L (ref 135–145)

## 2016-01-20 LAB — CBC
HEMATOCRIT: 44.2 % (ref 39.0–52.0)
Hemoglobin: 15.1 g/dL (ref 13.0–17.0)
MCH: 29.5 pg (ref 26.0–34.0)
MCHC: 34.2 g/dL (ref 30.0–36.0)
MCV: 86.3 fL (ref 78.0–100.0)
PLATELETS: 274 10*3/uL (ref 150–400)
RBC: 5.12 MIL/uL (ref 4.22–5.81)
RDW: 12.1 % (ref 11.5–15.5)
WBC: 8.1 10*3/uL (ref 4.0–10.5)

## 2016-01-20 LAB — I-STAT TROPONIN, ED: TROPONIN I, POC: 0.01 ng/mL (ref 0.00–0.08)

## 2016-01-20 MED ORDER — IBUPROFEN 800 MG PO TABS
800.0000 mg | ORAL_TABLET | Freq: Once | ORAL | Status: AC
  Administered 2016-01-20: 800 mg via ORAL
  Filled 2016-01-20: qty 1

## 2016-01-20 MED ORDER — ACETAMINOPHEN 325 MG PO TABS
650.0000 mg | ORAL_TABLET | Freq: Once | ORAL | Status: AC
  Administered 2016-01-20: 650 mg via ORAL
  Filled 2016-01-20: qty 2

## 2016-01-20 NOTE — Discharge Instructions (Signed)
Please follow-up with her primary care for reevaluation further management of ongoing symptoms. Please return to the emergency room immediately if he expands any new or worsening signs or symptoms

## 2016-01-20 NOTE — ED Triage Notes (Signed)
Pt reports dizziness, HTN and headache ongoing since this morning. Pt reports chest pain and L arm/shoulder pain. States BP at home was 150/110? Spanish speaking.

## 2016-01-20 NOTE — ED Provider Notes (Signed)
[facility]-EMERGENCY DEPT Provider Note   CSN: [id number] Arrival date & time: 01/20/16  0453     History   Chief Complaint Chief Complaint  Patient presents with  . Hypertension  . Headache    HPI [name] is a 55 y.o. male.  HPI   55 year old male presents today with complaints hypertension headache. Patient reports yesterday he took his blood pressure and reports that it was in the "110-150 range". He notes from the same time he developed a headache, and multiple attempts at having patient describes the headache were unsuccessful. Patient denies any associated neurological deficits, neck stiffness, fever or chills, or any red flags for headache. Patient notes he took his antihypertensive medication yesterday, but has not taken it today. Patient reports her on the same time he started having left shoulder pain. He reports the pain is similar to previous, worse with movement of the shoulder, significantly tender to palpation, denies any weakness or neuro deficits in the extremity. Patient denies any chest pain other than the chest wall pain, shortness of breath, cough, diaphoresis. Patient reports he is otherwise healthy with no high cholesterol, diabetes, personal history of cardiac dysfunction. He reports his father had an MI at the age of 55. The nonsmoker. Patient did not try any over-the-counter medications yesterday or today for shoulder pain.   Past Medical History:  Diagnosis Date  . Hypertension     Patient Active Problem List   Diagnosis Date Noted  . 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 Hypertension     History reviewed. No pertinent surgical history.   Home Medications    Prior to Admission medications   Medication Sig Start Date End Date Taking? Authorizing Provider  amLODipine (NORVASC) 10 MG tablet Take 10 mg by mouth daily.    Yes Historical Provider, MD  valsartan-hydrochlorothiazide (DIOVAN-HCT) 320-12.5 MG per tablet Take 1 tablet by mouth  daily.   Yes Historical Provider, MD  aspirin EC 81 MG EC tablet Take 1 tablet (81 mg total) by mouth daily. Patient not taking: Reported on 01/20/2016 12/02/13   Brett ElkAbraham Feliz Ortiz, MD  cyclobenzaprine (FLEXERIL) 10 MG tablet Take 1 tablet (10 mg total) by mouth 2 (two) times daily as needed for muscle spasms. Patient not taking: Reported on 01/20/2016 10/06/14   Brett NapoleonHope M Neese, NP  diclofenac (VOLTAREN) 75 MG EC tablet Take 1 tablet (75 mg total) by mouth 2 (two) times daily. Patient not taking: Reported on 01/20/2016 10/06/14   Brett NapoleonHope M Neese, NP  meclizine (ANTIVERT) 25 MG tablet Take 1 tablet (25 mg total) by mouth 3 (three) times daily as needed for dizziness. Patient not taking: Reported on 01/20/2016 09/21/15   Brett KaplanAnkit Nanavati, MD    Family History No family history on file.  Social History Social History  Substance Use Topics  . Smoking status: Never Smoker  . Smokeless tobacco: Never Used  . Alcohol use No     Allergies   Review of patient's allergies indicates no known allergies.   Review of Systems Review of Systems  All other systems reviewed and are negative.    Physical Exam Updated Vital Signs BP 130/83 (BP Location: Right Arm)   Pulse (!) 59   Temp 97.9 F (36.6 C) (Oral)   Resp 13   Ht 5\' 7"  (1.702 m)   Wt 84.9 kg   SpO2 99%   BMI 29.30 kg/m   Physical Exam  Constitutional: He is oriented to person, place, and time. He appears well-developed and well-nourished.  HENT:  Head: Normocephalic and atraumatic.  Eyes: Conjunctivae are normal. Pupils are equal, round, and reactive to light. Right eye exhibits no discharge. Left eye exhibits no discharge. No scleral icterus.  Neck: Normal range of motion. No JVD present. No tracheal deviation present.  Cardiovascular: Normal rate, regular rhythm, normal heart sounds and intact distal pulses.  Exam reveals no gallop and no friction rub.   No murmur heard. Pulmonary/Chest: Effort normal and breath sounds normal. No  stridor. No respiratory distress. He has no wheezes. He has no rales. He exhibits no tenderness.  Musculoskeletal:  Exquisite tenderness to even light palpation of the left anterior chest. Significant pain with range of motion of the shoulder, distal sensation strength and motor function intact. No rash. Tenderness to palpation of the left anterior chest wall.  Neurological: He is alert and oriented to person, place, and time. No cranial nerve deficit or sensory deficit. Coordination normal. GCS eye subscore is 4. GCS verbal subscore is 5. GCS motor subscore is 6.  Psychiatric: He has a normal mood and affect. His behavior is normal. Judgment and thought content normal.  Nursing note and vitals reviewed.    ED Treatments / Results  Labs (all labs ordered are listed, but only abnormal results are displayed) Labs Reviewed  BASIC METABOLIC PANEL - Abnormal; Notable for the following:       Result Value   Glucose, Bld 102 (*)    All other components within normal limits  CBC  I-STAT TROPOININ, ED    EKG  EKG Interpretation  Date/Time:  Thursday January 20 2016 05:53:32 EDT Ventricular Rate:  63 PR Interval:  150 QRS Duration: 92 QT Interval:  392 QTC Calculation: 401 R Axis:   18 Text Interpretation:  Sinus rhythm with Premature atrial complexes Low voltage QRS Borderline ECG No significant change since last tracing Confirmed by Ethelda Chick  MD, SAM [phone]) on 01/20/2016 10:10:30 AM       Radiology Dg Chest 2 View  Result Date: 01/20/2016 CLINICAL DATA:  Pt reports dizziness, HTN and headache ongoing since this morning. Pt reports chest pain and L arm/shoulder pain. HTN, nonsmoker EXAM: CHEST  2 VIEW COMPARISON:  07/14/2012 FINDINGS: The heart size and mediastinal contours are within normal limits. Both lungs are clear. The visualized skeletal structures are unremarkable. IMPRESSION: No active cardiopulmonary disease. Electronically Signed   By: Burman Nieves M.D.   On:  01/20/2016 06:24    Procedures Procedures (including critical care time)  Medications Ordered in ED Medications  ibuprofen (ADVIL,MOTRIN) tablet 800 mg (800 mg Oral Given 01/20/16 1114)  acetaminophen (TYLENOL) tablet 650 mg (650 mg Oral Given 01/20/16 1114)     Initial Impression / Assessment and Plan / ED Course  I have reviewed the triage vital signs and the nursing notes.  Pertinent labs & imaging results that were available during my care of the patient were reviewed by me and considered in my medical decision making (see chart for details).  Clinical Course     Final Clinical Impressions(s) / ED Diagnoses   Final diagnoses:  Acute intractable headache, unspecified headache type  Essential hypertension  Left shoulder pain   Labs:  Imaging:  Consults:  Therapeutics:  Discharge Meds:   Assessment/Plan:  55 year old male presents today with complaints hypertension headache and shoulder pain. Patient has blood pressure 130/83 here in the ED, no signs of tachycardia. Patient's blood pressure is not concerning at this time, he has no associated signs or symptoms that would indicate  end organ damage. Patient reports left shoulder pain, this is musculoskeletal in nature, patient will now let me complete physical exam due to pain with palpation and range of motion of the shoulder. I have very low suspicion for ACS in this patient, he has a very low heart score of 2 with no signs or symptoms consistent with ACS. Patient will be treated for pain, discharged home with primary care follow-up, strict return precautions.  Recheck of patient shows pain completely gone.    New Prescriptions New Prescriptions   No medications on file     Eyvonne Mechanic, PA-C 01/20/16 1147    Doug Sou, MD 01/20/16 1752

## 2016-05-05 ENCOUNTER — Encounter (HOSPITAL_COMMUNITY): Payer: Self-pay

## 2016-05-05 ENCOUNTER — Emergency Department (HOSPITAL_COMMUNITY)
Admission: EM | Admit: 2016-05-05 | Discharge: 2016-05-06 | Disposition: A | Discharge status: Home / Self Care | Payer: PRIVATE HEALTH INSURANCE | Attending: Emergency Medicine | Admitting: Emergency Medicine

## 2016-05-05 DIAGNOSIS — Z79899 Other long term (current) drug therapy: Secondary | ICD-10-CM | POA: Insufficient documentation

## 2016-05-05 DIAGNOSIS — I10 Essential (primary) hypertension: Secondary | ICD-10-CM | POA: Insufficient documentation

## 2016-05-05 DIAGNOSIS — L03211 Cellulitis of face: Secondary | ICD-10-CM

## 2016-05-05 MED ORDER — CLINDAMYCIN PHOSPHATE 600 MG/50ML IV SOLN
600.0000 mg | Freq: Once | INTRAVENOUS | Status: AC
  Administered 2016-05-06: 600 mg via INTRAVENOUS
  Filled 2016-05-05: qty 50

## 2016-05-05 MED ORDER — KETOROLAC TROMETHAMINE 30 MG/ML IJ SOLN
30.0000 mg | Freq: Once | INTRAMUSCULAR | Status: AC
Start: 2016-05-05 — End: 2016-05-06
  Administered 2016-05-06: 30 mg via INTRAVENOUS
  Filled 2016-05-05: qty 1

## 2016-05-05 NOTE — ED Provider Notes (Signed)
[facility]-EMERGENCY DEPT Provider Note   CSN: [id number] Arrival date & time: 05/05/16  1429  By signing my name below, I, Alyssa GroveMartin Green, attest that this documentation has been prepared under the direction and in the presence of Jerelyn ScottMartha Linker, MD. Electronically Signed: Alyssa GroveMartin Green, ED Scribe. 05/05/16. 11:34 PM.   History   Chief Complaint Chief Complaint  Patient presents with  . Headache   He states rhinorrhea began 3 days ago. He has not been blowing his nose excessively, but yesterday at work he started to experience pain and mild swelling to the tip of his nose. He states he also has dental pain to the upper front teeth. He is compliant with medications.  Pt has tried Tylenol and Vicks with no relief. Rhinorrhea began on Monday. He state he did not blow his nose excessively. NKDA.    The history is provided by the patient. No language interpreter was used.  Headache   This is a new problem. The current episode started more than 2 days ago. The problem occurs constantly. The problem has not changed since onset.The pain is moderate. The pain does not radiate. Associated symptoms include a fever (subjective). Pertinent negatives include no vomiting.    Past Medical History:  Diagnosis Date  . Hypertension     Patient Active Problem List   Diagnosis Date Noted  . 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 Hypertension     History reviewed. No pertinent surgical history.   Home Medications    Prior to Admission medications   Medication Sig Start Date End Date Taking? Authorizing Provider  acetaminophen (TYLENOL) 325 MG tablet Take 325 mg by mouth every 6 (six) hours as needed for mild pain.   Yes Historical Provider, MD  amLODipine (NORVASC) 10 MG tablet Take 10 mg by mouth daily.    Yes Historical Provider, MD  Misc Natural Products (SINUS FORMULA PO) Take 1 tablet by mouth daily as needed (congestion).   Yes Historical Provider, MD  valsartan-hydrochlorothiazide (DIOVAN-HCT)  320-12.5 MG per tablet Take 1 tablet by mouth daily.   Yes Historical Provider, MD  clindamycin (CLEOCIN) 300 MG capsule Take 1 capsule (300 mg total) by mouth 3 (three) times daily. 05/06/16   Jerelyn ScottMartha Linker, MD  oxyCODONE-acetaminophen (PERCOCET/ROXICET) 5-325 MG tablet Take 1-2 tablets by mouth every 6 (six) hours as needed for severe pain. 05/06/16   Jerelyn ScottMartha Linker, MD    Family History No family history on file.  Social History Social History  Substance Use Topics  . Smoking status: Never Smoker  . Smokeless tobacco: Never Used  . Alcohol use No     Allergies   Patient has no known allergies.   Review of Systems Review of Systems  Constitutional: Positive for chills and fever (subjective).  HENT: Positive for facial swelling (nose) and rhinorrhea.   Respiratory: Negative for cough.   Gastrointestinal: Negative for vomiting.  Musculoskeletal:       Generalized body aches  Neurological: Positive for headaches.  All other systems reviewed and are negative.   Physical Exam Updated Vital Signs BP 112/69   Pulse (!) 59   Temp 100.3 F (37.9 C) (Oral)   Resp 18   Ht 5\' 7"  (1.702 m)   Wt 187 lb (84.8 kg)   SpO2 94%   BMI 29.29 kg/m  Vitals reviewed Physical Exam Physical Examination: General appearance - alert, well appearing, and in no distress Mental status - alert, oriented to person, place, and time Eyes - no conjunctival injection, no  scleral icterus Nose - diffuse erythema of nose with induration, small pustule on left side of nare, no fluctuance, tender to palpation, nares patent Mouth - mucous membranes moist, pharynx normal without lesions, poor dentition diffusely, ttp over upper frontal gingiva- no palpable abscess, no swelling under the tongue Neck - supple, no significant adenopathy Chest - clear to auscultation, no wheezes, rales or rhonchi, symmetric air entry Heart - normal rate, regular rhythm, normal S1, S2, no murmurs, rubs, clicks or gallops Abdomen -  soft, nontender, nondistended, no masses or organomegaly Neurological - alert, oriented x 3, normal speech Extremities - peripheral pulses normal, no pedal edema, no clubbing or cyanosis Skin - normal coloration and turgor, no rashes  ED Treatments / Results  DIAGNOSTIC STUDIES: Oxygen Saturation is 99% on RA, normal by my interpretation.    COORDINATION OF CARE: 11:22 PM Discussed treatment plan with pt at bedside which includes BMP, CBC with differential, Toradol and Clindamycin and pt agreed to plan.  Labs (all labs ordered are listed, but only abnormal results are displayed) Labs Reviewed  CBC WITH DIFFERENTIAL/PLATELET - Abnormal; Notable for the following:       Result Value   WBC 13.0 (*)    Neutro Abs 8.9 (*)    Monocytes Absolute 1.4 (*)    All other components within normal limits  BASIC METABOLIC PANEL - Abnormal; Notable for the following:    Potassium 3.0 (*)    Chloride 97 (*)    Glucose, Bld 128 (*)    All other components within normal limits  URINALYSIS, ROUTINE W REFLEX MICROSCOPIC - Abnormal; Notable for the following:    Hgb urine dipstick SMALL (*)    Bacteria, UA RARE (*)    All other components within normal limits  I-STAT CG4 LACTIC ACID, ED - Abnormal; Notable for the following:    Lactic Acid, Venous 2.10 (*)    All other components within normal limits  CULTURE, BLOOD (ROUTINE X 2)  CULTURE, BLOOD (ROUTINE X 2)  URINE CULTURE  HEPATIC FUNCTION PANEL  I-STAT CG4 LACTIC ACID, ED    EKG  EKG Interpretation None       Radiology Ct Maxillofacial W Contrast  Result Date: 05/06/2016 CLINICAL DATA:  Swollen section with pus-like discharge on right side of nose EXAM: CT MAXILLOFACIAL WITH CONTRAST TECHNIQUE: Multidetector CT imaging of the maxillofacial structures was performed. Multiplanar CT image reconstructions were also generated. A small metallic BB was placed on the right temple in order to reliably differentiate right from left. COMPARISON:   Head CT 12/02/2013 CONTRAST:  75 mL Isovue 300 intravenous FINDINGS: Osseous: Mandible is without fracture. Normal positioning of the mandibular heads. Zygomatic arches are intact. Pterygoid plates and nasal bones are intact. Mild leftward bowing of the nasal septum. There is lucency around the bilateral upper molar teeth left greater than right. Orbits: Orbital walls are intact. There is no intra or extraconal soft tissue abnormality. Globes are unremarkable Sinuses: Mild mucosal thickening within the maxillary sinuses. Minimal mucosal thickening in the ethmoid sinuses. No acute fluid levels. No sinus wall fracture. Soft tissues: Mild soft tissue swelling over the nasal area add mild edema anterior to the right maxillary sinus. Asymmetric soft tissue thickening of the right supraorbital soft tissues. No well-defined abscess. Limited intracranial: Grossly unremarkable IMPRESSION: 1. Soft tissue swelling over the nasal area, anterior to the right maxilla, and in the right supraorbital region but without well-defined soft tissue abscess. No evidence for an orbital cellulitis 2. Mild lucencies  around the upper molar teeth, consistent with periodontal disease. Electronically Signed   By: Jasmine Pang M.D.   On: 05/06/2016 03:09    Procedures Procedures (including critical care time)  Medications Ordered in ED Medications  clindamycin (CLEOCIN) IVPB 600 mg (0 mg Intravenous Stopped 05/06/16 0157)  ketorolac (TORADOL) 30 MG/ML injection 30 mg (30 mg Intravenous Given 05/06/16 0059)  sodium chloride 0.9 % bolus 1,000 mL (0 mLs Intravenous Stopped 05/06/16 0157)  piperacillin-tazobactam (ZOSYN) IVPB 3.375 g (0 g Intravenous Stopped 05/06/16 0257)  vancomycin (VANCOCIN) IVPB 1000 mg/200 mL premix (0 mg Intravenous Stopped 05/06/16 0316)  potassium chloride SA (K-DUR,KLOR-CON) CR tablet 40 mEq (40 mEq Oral Given 05/06/16 0154)  iopamidol (ISOVUE-300) 61 % injection (75 mLs  Contrast Given 05/06/16 0230)     Initial  Impression / Assessment and Plan / ED Course  I have reviewed the triage vital signs and the nursing notes.  Pertinent labs & imaging results that were available during my care of the patient were reviewed by me and considered in my medical decision making (see chart for details).  Clinical Course    4:40 AM d/w Dr. Katrinka Blazing, triad for admission for facial cellulitis- he will see patient and call me back.  May be OK for discharge with po antibiotics.    4:46 AM Dr. Katrinka Blazing has seen patient, recommends outpatient management with po antibiotics- pt is agreeable with this plan as well.  Will have him followup in the ED for recheck in 2 days.    Pt with erythema/pain of nose and upper gingiva- small pustule on left anterior nare- labs show mild elevation in lactate, leukocytosis, CT maxillofacial obtained and shows facial cellulitis with no discrete abscess. Pt started on IV abx, d/w hospitalist who saw patient and feels he is stable for trial of outpatient management.  Pt advised to f/u in 48 hours in the ED for recheck.  Discharged with strict return precautions.  Pt agreeable with plan.  Final Clinical Impressions(s) / ED Diagnoses   Final diagnoses:  Facial cellulitis    New Prescriptions Discharge Medication List as of 05/06/2016  4:50 AM    START taking these medications   Details  clindamycin (CLEOCIN) 300 MG capsule Take 1 capsule (300 mg total) by mouth 3 (three) times daily., Starting Sat 05/06/2016, Print    oxyCODONE-acetaminophen (PERCOCET/ROXICET) 5-325 MG tablet Take 1-2 tablets by mouth every 6 (six) hours as needed for severe pain., Starting Sat 05/06/2016, Print       I personally performed the services described in this documentation, which was scribed in my presence. The recorded information has been reviewed and is accurate.     Jerelyn Scott, MD 05/07/16 [phone]

## 2016-05-05 NOTE — ED Notes (Signed)
Pt's son is translating for pt. Pt states he has had a headache for 2 days, bones and body hurt, nose hurt, and teeth hurt. Pt also complains of swelling to his nose.

## 2016-05-05 NOTE — ED Triage Notes (Signed)
Pt reports headache for 3 days and also reports burning to nose and lips and states his teeth hurt.

## 2016-05-06 ENCOUNTER — Emergency Department (HOSPITAL_COMMUNITY): Payer: PRIVATE HEALTH INSURANCE

## 2016-05-06 LAB — URINALYSIS, ROUTINE W REFLEX MICROSCOPIC
Bilirubin Urine: NEGATIVE
Glucose, UA: NEGATIVE mg/dL
KETONES UR: NEGATIVE mg/dL
Leukocytes, UA: NEGATIVE
Nitrite: NEGATIVE
PROTEIN: NEGATIVE mg/dL
RBC / HPF: NONE SEEN RBC/hpf (ref 0–5)
SQUAMOUS EPITHELIAL / LPF: NONE SEEN
Specific Gravity, Urine: 1.024 (ref 1.005–1.030)
pH: 5 (ref 5.0–8.0)

## 2016-05-06 LAB — CBC WITH DIFFERENTIAL/PLATELET
Basophils Absolute: 0 10*3/uL (ref 0.0–0.1)
Basophils Relative: 0 %
Eosinophils Absolute: 0.2 10*3/uL (ref 0.0–0.7)
Eosinophils Relative: 1 %
HCT: 46 % (ref 39.0–52.0)
Hemoglobin: 16.2 g/dL (ref 13.0–17.0)
Lymphocytes Relative: 19 %
Lymphs Abs: 2.5 10*3/uL (ref 0.7–4.0)
MCH: 30 pg (ref 26.0–34.0)
MCHC: 35.2 g/dL (ref 30.0–36.0)
MCV: 85.2 fL (ref 78.0–100.0)
Monocytes Absolute: 1.4 10*3/uL — ABNORMAL HIGH (ref 0.1–1.0)
Monocytes Relative: 11 %
Neutro Abs: 8.9 10*3/uL — ABNORMAL HIGH (ref 1.7–7.7)
Neutrophils Relative %: 69 %
Platelets: 263 10*3/uL (ref 150–400)
RBC: 5.4 MIL/uL (ref 4.22–5.81)
RDW: 12.1 % (ref 11.5–15.5)
WBC: 13 10*3/uL — ABNORMAL HIGH (ref 4.0–10.5)

## 2016-05-06 LAB — HEPATIC FUNCTION PANEL
ALT: 30 U/L (ref 17–63)
AST: 32 U/L (ref 15–41)
Albumin: 3.6 g/dL (ref 3.5–5.0)
Alkaline Phosphatase: 86 U/L (ref 38–126)
BILIRUBIN DIRECT: 0.2 mg/dL (ref 0.1–0.5)
BILIRUBIN INDIRECT: 0.4 mg/dL (ref 0.3–0.9)
TOTAL PROTEIN: 7.3 g/dL (ref 6.5–8.1)
Total Bilirubin: 0.6 mg/dL (ref 0.3–1.2)

## 2016-05-06 LAB — I-STAT CG4 LACTIC ACID, ED
Lactic Acid, Venous: 0.85 mmol/L (ref 0.5–1.9)
Lactic Acid, Venous: 2.1 mmol/L (ref 0.5–1.9)

## 2016-05-06 LAB — BASIC METABOLIC PANEL
Anion gap: 12 (ref 5–15)
BUN: 14 mg/dL (ref 6–20)
CO2: 28 mmol/L (ref 22–32)
Calcium: 9.4 mg/dL (ref 8.9–10.3)
Chloride: 97 mmol/L — ABNORMAL LOW (ref 101–111)
Creatinine, Ser: 1.21 mg/dL (ref 0.61–1.24)
GFR calc Af Amer: >60 mL/min (ref >60)
GFR calc non Af Amer: >60 mL/min (ref >60)
Glucose, Bld: 128 mg/dL — ABNORMAL HIGH (ref 65–99)
Potassium: 3 mmol/L — ABNORMAL LOW (ref 3.5–5.1)
Sodium: 137 mmol/L (ref 135–145)

## 2016-05-06 MED ORDER — OXYCODONE-ACETAMINOPHEN 5-325 MG PO TABS: 1.0000 | ORAL_TABLET | Freq: Four times a day (QID) | ORAL | 0 refills | Status: DC | PRN

## 2016-05-06 MED ORDER — VANCOMYCIN HCL IN DEXTROSE 1-5 GM/200ML-% IV SOLN
1000.0000 mg | Freq: Once | INTRAVENOUS | Status: AC
  Administered 2016-05-06: 1000 mg via INTRAVENOUS
  Filled 2016-05-06: qty 200

## 2016-05-06 MED ORDER — PIPERACILLIN-TAZOBACTAM 3.375 G IVPB 30 MIN
3.3750 g | Freq: Once | INTRAVENOUS | Status: AC
Start: 2016-05-06 — End: 2016-05-06
  Administered 2016-05-06: 3.375 g via INTRAVENOUS
  Filled 2016-05-06: qty 50

## 2016-05-06 MED ORDER — IOPAMIDOL (ISOVUE-300) INJECTION 61%
INTRAVENOUS | Status: AC
  Administered 2016-05-06: 75 mL
  Filled 2016-05-06: qty 75

## 2016-05-06 MED ORDER — CLINDAMYCIN HCL 300 MG PO CAPS: 300.0000 mg | ORAL_CAPSULE | Freq: Three times a day (TID) | ORAL | 0 refills | Status: DC

## 2016-05-06 MED ORDER — POTASSIUM CHLORIDE CRYS ER 20 MEQ PO TBCR
40.0000 meq | EXTENDED_RELEASE_TABLET | Freq: Once | ORAL | Status: AC
  Administered 2016-05-06: 40 meq via ORAL
  Filled 2016-05-06: qty 2

## 2016-05-06 MED ORDER — SODIUM CHLORIDE 0.9 % IV BOLUS (SEPSIS)
1000.0000 mL | Freq: Once | INTRAVENOUS | Status: AC
  Administered 2016-05-06: 1000 mL via INTRAVENOUS

## 2016-05-06 NOTE — ED Notes (Signed)
Delay in lab draw,  Pt in bathroom. 

## 2016-05-06 NOTE — Discharge Instructions (Signed)
Return to the ED with any concerns including difficulty breathing, vomiting and not able to keep down liquids or medications, increased area of redness, decreased level of alertness/lethargy, or any other alarming symptoms

## 2016-05-07 LAB — URINE CULTURE: Culture: NO GROWTH

## 2016-05-11 LAB — CULTURE, BLOOD (ROUTINE X 2)
Culture: NO GROWTH
Culture: NO GROWTH

## 2016-06-11 ENCOUNTER — Emergency Department (HOSPITAL_COMMUNITY)
Admission: EM | Admit: 2016-06-11 | Discharge: 2016-06-11 | Disposition: A | Discharge status: Home / Self Care | Payer: PRIVATE HEALTH INSURANCE | Attending: Emergency Medicine | Admitting: Emergency Medicine

## 2016-06-11 ENCOUNTER — Encounter (HOSPITAL_COMMUNITY): Payer: Self-pay | Admitting: *Deleted

## 2016-06-11 DIAGNOSIS — Z79899 Other long term (current) drug therapy: Secondary | ICD-10-CM | POA: Insufficient documentation

## 2016-06-11 DIAGNOSIS — I10 Essential (primary) hypertension: Secondary | ICD-10-CM | POA: Insufficient documentation

## 2016-06-11 DIAGNOSIS — R42 Dizziness and giddiness: Secondary | ICD-10-CM | POA: Insufficient documentation

## 2016-06-11 MED ORDER — MECLIZINE HCL 25 MG PO TABS: 25.0000 mg | ORAL_TABLET | Freq: Three times a day (TID) | ORAL | 0 refills | Status: DC | PRN

## 2016-06-11 MED ORDER — MECLIZINE HCL 25 MG PO TABS
25.0000 mg | ORAL_TABLET | Freq: Once | ORAL | Status: AC
  Administered 2016-06-11: 25 mg via ORAL
  Filled 2016-06-11: qty 1

## 2016-06-11 NOTE — ED Triage Notes (Signed)
Meds for today already taken

## 2016-06-11 NOTE — ED Provider Notes (Signed)
[facility]-EMERGENCY DEPT Provider Note   CSN: [id number] Arrival date & time: 06/11/16  1315     History   Chief Complaint Chief Complaint  Patient presents with  . Dizziness    HPI [name] is a 56 y.o. male. CC:  Dizziness.  HPI:  Patient presents with his 2 sons were able to interpret for him. He is Spanish-speaking only. He has 2 complaints. First is that his left shoulder is sore. It hurts to lift or move and has been this way for over a year. He has no new injuries.  His main complaint is that he is dizzy. He describes assisting spinning around. It started yesterday morning when he got out of bed and has persisted intermittently with movement since that time. He does not have nausea or vomiting. He does not have numbness or weakness to extremities. No headache. No fall or injury. No past history of strokes. He has hypertension that is well controlled on valsartan and amlodipine.  Past Medical History:  Diagnosis Date  . Hypertension     Patient Active Problem List   Diagnosis Date Noted  . 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 Hypertension     History reviewed. No pertinent surgical history.     Home Medications    Prior to Admission medications   Medication Sig Start Date End Date Taking? Authorizing Provider  acetaminophen (TYLENOL) 325 MG tablet Take 325 mg by mouth every 6 (six) hours as needed for mild pain.    Historical Provider, MD  amLODipine (NORVASC) 10 MG tablet Take 10 mg by mouth daily.     Historical Provider, MD  clindamycin (CLEOCIN) 300 MG capsule Take 1 capsule (300 mg total) by mouth 3 (three) times daily. 05/06/16   Jerelyn ScottMartha Linker, MD  meclizine (ANTIVERT) 25 MG tablet Take 1 tablet (25 mg total) by mouth 3 (three) times daily as needed. 06/11/16   Rolland PorterMark Jermani Pund, MD  Misc Natural Products (SINUS FORMULA PO) Take 1 tablet by mouth daily as needed (congestion).    Historical Provider, MD  oxyCODONE-acetaminophen (PERCOCET/ROXICET) 5-325 MG  tablet Take 1-2 tablets by mouth every 6 (six) hours as needed for severe pain. 05/06/16   Jerelyn ScottMartha Linker, MD  valsartan-hydrochlorothiazide (DIOVAN-HCT) 320-12.5 MG per tablet Take 1 tablet by mouth daily.    Historical Provider, MD    Family History No family history on file.  Social History Social History  Substance Use Topics  . Smoking status: Never Smoker  . Smokeless tobacco: Never Used  . Alcohol use No     Allergies   Patient has no known allergies.   Review of Systems Review of Systems  Constitutional: Negative for appetite change, chills, diaphoresis, fatigue and fever.  HENT: Negative for mouth sores, sore throat and trouble swallowing.   Eyes: Negative for visual disturbance.  Respiratory: Negative for cough, chest tightness, shortness of breath and wheezing.   Cardiovascular: Negative for chest pain.  Gastrointestinal: Negative for abdominal distention, abdominal pain, diarrhea, nausea and vomiting.  Endocrine: Negative for polydipsia, polyphagia and polyuria.  Genitourinary: Negative for dysuria, frequency and hematuria.  Musculoskeletal: Negative for gait problem.  Skin: Negative for color change, pallor and rash.  Neurological: Positive for dizziness. Negative for syncope, light-headedness and headaches.  Hematological: Does not bruise/bleed easily.  Psychiatric/Behavioral: Negative for behavioral problems and confusion.     Physical Exam Updated Vital Signs BP 120/80   Pulse (!) 51   Temp 98.7 F (37.1 C)   Resp 15   SpO2  97%   Physical Exam  Constitutional: He is oriented to person, place, and time. He appears well-developed and well-nourished. No distress.  HENT:  Head: Normocephalic.  Eyes: Conjunctivae are normal. Pupils are equal, round, and reactive to light. No scleral icterus.  With movement patient complains of some slight dizziness. He has minimal horizontal nystagmus. Within 1 minute he states it improves. He has no vertical nystagmus. No  additional cranial nerve deficits.  Neck: Normal range of motion. Neck supple. No thyromegaly present.  Cardiovascular: Normal rate and regular rhythm.  Exam reveals no gallop and no friction rub.   No murmur heard. Pulmonary/Chest: Effort normal and breath sounds normal. No respiratory distress. He has no wheezes. He has no rales.  Abdominal: Soft. Bowel sounds are normal. He exhibits no distension. There is no tenderness. There is no rebound.  Musculoskeletal: Normal range of motion.  Neurological: He is alert and oriented to person, place, and time.  Intact symmetric cranial nerves.  Normal symmetric Strength to shoulder shrug, triceps, biceps, grip,wrist flex/extend,and intrinsics  Norma lsymmetric sensation above and below clavicles, and to all distributions to UEs. Norma symmetric strength to flex/.extend hip and knees, dorsi/plantar flex ankles. Normal symmetric sensation to all distributions to LEs Patellar and achilles reflexes 1-2+. Downgoing Babinski   Skin: Skin is warm and dry. No rash noted.  Psychiatric: He has a normal mood and affect. His behavior is normal.     ED Treatments / Results  Labs (all labs ordered are listed, but only abnormal results are displayed) Labs Reviewed - No data to display  EKG  EKG Interpretation  Date/Time:  Sunday June 11 2016 16:17:02 EST Ventricular Rate:  58 PR Interval:    QRS Duration: 86 QT Interval:  388 QTC Calculation: 381 R Axis:   24 Text Interpretation:  Sinus rhythm Probable left atrial enlargement Confirmed by Fayrene Fearing  MD, Concettina Leth ([id number]) on 06/11/2016 5:27:59 PM       Radiology No results found.  Procedures Procedures (including critical care time)  Medications Ordered in ED Medications  meclizine (ANTIVERT) tablet 25 mg (not administered)     Initial Impression / Assessment and Plan / ED Course  I have reviewed the triage vital signs and the nursing notes.  Pertinent labs & imaging results that were  available during my care of the patient were reviewed by me and considered in my medical decision making (see chart for details).    Acute peripheral vertigo. Given meclizine. ENT referral for possible vestibular maneuvers if not improving on vestibular suppressant medications. Driving and work precautions given.  Final Clinical Impressions(s) / ED Diagnoses   Final diagnoses:  Vertigo    New Prescriptions New Prescriptions   MECLIZINE (ANTIVERT) 25 MG TABLET    Take 1 tablet (25 mg total) by mouth 3 (three) times daily as needed.     Rolland Porter, MD 06/11/16 [phone]

## 2016-06-11 NOTE — Discharge Instructions (Signed)
No driving or working until 24 hours without dizziness. Take meclizine until 24 hours without dizziness.

## 2016-06-11 NOTE — ED Triage Notes (Signed)
To ED via pov for eval of dizziness and right side pain since yesterday morning. Constant. No aloc per family. Pt with hx of htn, no new meds or med change. Denies vomiting. Denies HA

## 2016-09-19 ENCOUNTER — Ambulatory Visit (HOSPITAL_COMMUNITY)
Admission: EM | Admit: 2016-09-19 | Discharge: 2016-09-19 | Disposition: A | Discharge status: Home / Self Care | Payer: Self-pay | Attending: Family Medicine | Admitting: Family Medicine

## 2016-09-19 ENCOUNTER — Encounter (HOSPITAL_COMMUNITY): Payer: Self-pay | Admitting: Emergency Medicine

## 2016-09-19 DIAGNOSIS — G44209 Tension-type headache, unspecified, not intractable: Secondary | ICD-10-CM

## 2016-09-19 HISTORY — DX: Dizziness and giddiness: R42

## 2016-09-19 MED ORDER — CYCLOBENZAPRINE HCL 10 MG PO TABS: 10.0000 mg | ORAL_TABLET | Freq: Two times a day (BID) | ORAL | 0 refills | Status: DC | PRN

## 2016-09-19 MED ORDER — NAPROXEN 500 MG PO TABS: 500.0000 mg | ORAL_TABLET | Freq: Two times a day (BID) | ORAL | 0 refills | Status: DC

## 2016-09-19 NOTE — ED Provider Notes (Signed)
CSN: [id number]     Arrival date & time 09/19/16  [id number] History   None    Chief Complaint  Patient presents with  . Headache   (Consider location/radiation/quality/duration/timing/severity/associated sxs/prior Treatment) Patient c/o headache for 2 days that wraps around head and c/o occipital discomfort.   The history is provided by the patient.  Headache  Pain location:  Occipital, R temporal, L temporal, frontal, L parietal and R parietal Quality:  Dull Severity currently:  5/10 Severity at highest:  5/10 Onset quality:  Sudden Duration:  2 days Progression:  Worsening Chronicity:  New Relieved by:  Nothing   Past Medical History:  Diagnosis Date  . Hypertension   . Vertigo    History reviewed. No pertinent surgical history. History reviewed. No pertinent family history. Social History  Substance Use Topics  . Smoking status: Never Smoker  . Smokeless tobacco: Never Used  . Alcohol use No    Review of Systems  Constitutional: Negative.   HENT: Negative.   Eyes: Negative.   Respiratory: Negative.   Cardiovascular: Negative.   Endocrine: Negative.   Genitourinary: Negative.   Allergic/Immunologic: Negative.   Neurological: Positive for headaches.    Allergies  Patient has no known allergies.  Home Medications   Prior to Admission medications   Medication Sig Start Date End Date Taking? Authorizing Provider  amLODipine (NORVASC) 10 MG tablet Take 10 mg by mouth daily.    Yes [provider]  meclizine (ANTIVERT) 25 MG tablet Take 1 tablet (25 mg total) by mouth 3 (three) times daily as needed. 06/11/16  Yes Rolland PorterJames, Mark, MD  valsartan-hydrochlorothiazide (DIOVAN-HCT) 320-25 MG tablet Take 1 tablet by mouth daily.   Yes [provider]  cyclobenzaprine (FLEXERIL) 10 MG tablet Take 1 tablet (10 mg total) by mouth 2 (two) times daily as needed for muscle spasms. 09/19/16   Deatra Canterxford, Vibhav Waddill J, FNP  naproxen (NAPROSYN) 500 MG tablet Take 1 tablet  (500 mg total) by mouth 2 (two) times daily with a meal. 09/19/16   [place], Anselm PancoastWilliam J, FNP   Meds Ordered and Administered this Visit  Medications - No data to display  BP 131/67 (BP Location: Right Arm)   Pulse (!) 59   Temp 98.5 F (36.9 C) (Oral)   Resp 18   SpO2 98%  No data found.   Physical Exam  Constitutional: He is oriented to person, place, and time. He appears well-developed and well-nourished.  HENT:  Head: Normocephalic.  Eyes: Conjunctivae and EOM are normal. Pupils are equal, round, and reactive to light.  Neck: Normal range of motion.  Cardiovascular: Normal rate, regular rhythm and normal heart sounds.   Pulmonary/Chest: Effort normal and breath sounds normal.  Musculoskeletal: He exhibits tenderness.  TTP occipital region of scalp.  Neurological: He is alert and oriented to person, place, and time.  Nursing note and vitals reviewed.   Urgent Care Course     Procedures (including critical care time)  Labs Review Labs Reviewed - No data to display  Imaging Review No results found.   Visual Acuity Review  Right Eye Distance:   Left Eye Distance:   Bilateral Distance:    Right Eye Near:   Left Eye Near:    Bilateral Near:         MDM   1. Tension headache    Naprosyn 500mg  one po bid x 10 days Flexeril 10mg  one po bid prn #20      Deatra CanterOxford, Aizlynn Digilio J, FNP 09/19/16 1749

## 2016-09-19 NOTE — ED Triage Notes (Signed)
[facility] a [facility] x 2 days. The patient denied a hx a migraine headaches. The patient did report a hx of vertigo and htn.

## 2017-02-06 ENCOUNTER — Emergency Department (HOSPITAL_COMMUNITY)
Admission: EM | Admit: 2017-02-06 | Discharge: 2017-02-06 | Disposition: A | Discharge status: Home / Self Care | Payer: PRIVATE HEALTH INSURANCE | Attending: Emergency Medicine | Admitting: Emergency Medicine

## 2017-02-06 ENCOUNTER — Other Ambulatory Visit: Payer: Self-pay

## 2017-02-06 ENCOUNTER — Encounter (HOSPITAL_COMMUNITY): Payer: Self-pay

## 2017-02-06 ENCOUNTER — Emergency Department (HOSPITAL_COMMUNITY): Payer: PRIVATE HEALTH INSURANCE

## 2017-02-06 DIAGNOSIS — I10 Essential (primary) hypertension: Secondary | ICD-10-CM | POA: Insufficient documentation

## 2017-02-06 DIAGNOSIS — Z79899 Other long term (current) drug therapy: Secondary | ICD-10-CM | POA: Insufficient documentation

## 2017-02-06 DIAGNOSIS — R0789 Other chest pain: Secondary | ICD-10-CM | POA: Diagnosis present

## 2017-02-06 LAB — CBC
HEMATOCRIT: 44.4 % (ref 39.0–52.0)
Hemoglobin: 15.1 g/dL (ref 13.0–17.0)
MCH: 29.3 pg (ref 26.0–34.0)
MCHC: 34 g/dL (ref 30.0–36.0)
MCV: 86 fL (ref 78.0–100.0)
Platelets: 265 10*3/uL (ref 150–400)
RBC: 5.16 MIL/uL (ref 4.22–5.81)
RDW: 12.4 % (ref 11.5–15.5)
WBC: 8.2 10*3/uL (ref 4.0–10.5)

## 2017-02-06 LAB — BASIC METABOLIC PANEL
Anion gap: 9 (ref 5–15)
BUN: 21 mg/dL — AB (ref 6–20)
CO2: 26 mmol/L (ref 22–32)
Calcium: 9 mg/dL (ref 8.9–10.3)
Chloride: 103 mmol/L (ref 101–111)
Creatinine, Ser: 1.18 mg/dL (ref 0.61–1.24)
GFR calc Af Amer: >60 mL/min (ref >60)
GLUCOSE: 101 mg/dL — AB (ref 65–99)
POTASSIUM: 3.6 mmol/L (ref 3.5–5.1)
Sodium: 138 mmol/L (ref 135–145)

## 2017-02-06 LAB — I-STAT TROPONIN, ED: Troponin i, poc: 0 ng/mL (ref 0.00–0.08)

## 2017-02-06 NOTE — ED Triage Notes (Signed)
Pt states that he began to have central CP that woke him from his sleep, radiation to back, denies n/v, some SOB.

## 2017-02-06 NOTE — ED Provider Notes (Signed)
[facility]-EMERGENCY DEPT Provider Note   CSN: [id number] Arrival date & time: 02/06/17  0407     History   Chief Complaint Chief Complaint  Patient presents with  . Chest Pain    HPI Brett Jones is a 56 y.o. male who presents with chest pain. PMH significant for HTN. He states that the pain acutely woke him up from sleep at 3AM. The pain comes and goes and sometimes he feels it in his back. He cannot describe the pain. It is intermittent. He reports associated fatigue and back pain with the chest pain. No fever or recent illness, lightheadedness/syncope, SOB, cough, abdominal pain, N/V. 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 All his pain is resolved currently. He has not had this before. Nothing makes it better or worse. He does not smoke. No family hx of heart disease. He has not had a catheterization or stress testing before.  History is limited due to language barrier. A spanish interpreter was used.   HPI  Past Medical History:  Diagnosis Date  . Hypertension   . Vertigo     Patient Active Problem List   Diagnosis Date Noted  . 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 Hypertension     History reviewed. No pertinent surgical history.     Home Medications    Prior to Admission medications   Medication Sig Start Date End Date Taking? Authorizing Provider  amLODipine (NORVASC) 10 MG tablet Take 10 mg by mouth daily.     [provider]  cyclobenzaprine (FLEXERIL) 10 MG tablet Take 1 tablet (10 mg total) by mouth 2 (two) times daily as needed for muscle spasms. 09/19/16   Deatra Canter, FNP  meclizine (ANTIVERT) 25 MG tablet Take 1 tablet (25 mg total) by mouth 3 (three) times daily as needed. 06/11/16   Rolland Porter, MD  naproxen (NAPROSYN) 500 MG tablet Take 1 tablet (500 mg total) by mouth 2 (two) times daily with a meal. 09/19/16   Deatra Canter, FNP  valsartan-hydrochlorothiazide (DIOVAN-HCT) 320-25 MG tablet Take 1 tablet by mouth daily.    [provider]    Family History No family history on file.  Social History Social History  Substance Use Topics  . Smoking status: Never Smoker  . Smokeless tobacco: Never Used  . Alcohol use No     Allergies   Patient has no known allergies.   Review of Systems Review of Systems  Constitutional: Negative for appetite change, chills and fever.  Respiratory: Negative for cough and shortness of breath.   Cardiovascular: Positive for chest pain.  Gastrointestinal: Negative for abdominal pain, nausea and vomiting.  Musculoskeletal: Positive for back pain.  Neurological: Negative for syncope and light-headedness.  All other systems reviewed and are negative.    Physical Exam Updated Vital Signs BP (!) 155/93   Pulse (!) 57   Temp 97.8 F (36.6 C)   Resp 20   SpO2 100%   Physical Exam  Constitutional: He is oriented to person, place, and time. He appears well-developed and well-nourished. No distress.  HENT:  Head: Normocephalic and atraumatic.  Eyes: Pupils are equal, round, and reactive to light. Conjunctivae are normal. Right eye exhibits no discharge. Left eye exhibits no discharge. No scleral icterus.  Neck: Normal range of motion.  Cardiovascular: Regular rhythm.  Bradycardia present.  Exam reveals no gallop and no friction rub.   No murmur heard. Pulmonary/Chest: Effort normal and breath sounds normal. No respiratory distress. He has no wheezes.  He has no rales. He exhibits no tenderness.  Abdominal: Soft. Bowel sounds are normal. He exhibits no distension and no mass. There is no tenderness. There is no rebound and no guarding. No hernia.  Musculoskeletal:  No back tenderness  Neurological: He is alert and oriented to person, place, and time.  Skin: Skin is warm and dry.  Psychiatric: He has a normal mood and affect. His behavior is normal.  Nursing note and vitals reviewed.    ED Treatments / Results  Labs (all labs ordered are listed, but only  abnormal results are displayed) Labs Reviewed  BASIC METABOLIC PANEL - Abnormal; Notable for the following:       Result Value   Glucose, Bld 101 (*)    BUN 21 (*)    All other components within normal limits  CBC  I-STAT TROPONIN, ED    EKG  EKG Interpretation None       Radiology Dg Chest 2 View  Result Date: 02/06/2017 CLINICAL DATA:  Acute onset of central chest pain, radiating to the back. Shortness of breath. Initial encounter. EXAM: CHEST  2 VIEW COMPARISON:  Chest radiograph performed 01/20/2016 FINDINGS: The lungs are well-aerated and clear. There is no evidence of focal opacification, pleural effusion or pneumothorax. The heart is normal in size; the mediastinal contour is within normal limits. No acute osseous abnormalities are seen. IMPRESSION: No acute cardiopulmonary process seen. Electronically Signed   By: Roanna Raider M.D.   On: 02/06/2017 05:08    Procedures Procedures (including critical care time)  Medications Ordered in ED Medications - No data to display   Initial Impression / Assessment and Plan / ED Course  I have reviewed the triage vital signs and the nursing notes.  Pertinent labs & imaging results that were available during my care of the patient were reviewed by me and considered in my medical decision making (see chart for details).  56 year old male presents with atypical chest pain/back pain. Pain has resolved. He is hypertensive but otherwise vitals are normal. Chest pain work up is reassuring. Doubt ACS, PE, pericarditis, esophageal rupture, tension pneumothorax, aortic dissection, cardiac tamponade. EKG is sinus bradycardia. CXR is negative. Initial troponin is 0. Labs are unremarkable. No significant past or family hx of cardiac disease. Patient is non-smoker. HEART score is 1. Advised f/u with PCP.   Final Clinical Impressions(s) / ED Diagnoses   Final diagnoses:  Atypical chest pain    New Prescriptions New Prescriptions   No  medications on file     Bethel Born, Cordelia Poche 02/06/17 1610    Gwyneth Sprout, MD 02/06/17 1539

## 2017-02-06 NOTE — ED Notes (Signed)
C/o of chest pain of which woke him from sleep states the pain comes and goes. Currently painfree. States pain was worse with inspiration.

## 2018-01-01 ENCOUNTER — Emergency Department (HOSPITAL_COMMUNITY)
Admission: EM | Admit: 2018-01-01 | Discharge: 2018-01-01 | Discharge status: Against Medical Advice | Payer: PRIVATE HEALTH INSURANCE

## 2018-01-01 ENCOUNTER — Other Ambulatory Visit: Payer: Self-pay

## 2018-01-01 NOTE — ED Notes (Signed)
Called pt x3 for triage, no response. 

## 2018-01-01 NOTE — ED Notes (Signed)
Called pt x2 for triage, no response. 

## 2018-01-01 NOTE — ED Notes (Signed)
Called pt triage, no answer in lobby

## 2019-02-12 ENCOUNTER — Emergency Department (HOSPITAL_COMMUNITY): Payer: Self-pay

## 2019-02-12 ENCOUNTER — Encounter (HOSPITAL_COMMUNITY): Payer: Self-pay

## 2019-02-12 ENCOUNTER — Other Ambulatory Visit: Payer: Self-pay

## 2019-02-12 ENCOUNTER — Emergency Department (HOSPITAL_COMMUNITY)
Admission: EM | Admit: 2019-02-12 | Discharge: 2019-02-13 | Disposition: A | Discharge status: Home / Self Care | Payer: Self-pay | Attending: Emergency Medicine | Admitting: Emergency Medicine

## 2019-02-12 DIAGNOSIS — E876 Hypokalemia: Secondary | ICD-10-CM | POA: Insufficient documentation

## 2019-02-12 DIAGNOSIS — Z79899 Other long term (current) drug therapy: Secondary | ICD-10-CM | POA: Insufficient documentation

## 2019-02-12 DIAGNOSIS — U071 COVID-19: Secondary | ICD-10-CM | POA: Insufficient documentation

## 2019-02-12 DIAGNOSIS — I10 Essential (primary) hypertension: Secondary | ICD-10-CM | POA: Insufficient documentation

## 2019-02-12 DIAGNOSIS — Z20822 Contact with and (suspected) exposure to covid-19: Secondary | ICD-10-CM

## 2019-02-12 LAB — COMPREHENSIVE METABOLIC PANEL
ALT: 34 U/L (ref 0–44)
AST: 31 U/L (ref 15–41)
Albumin: 4 g/dL (ref 3.5–5.0)
Alkaline Phosphatase: 71 U/L (ref 38–126)
Anion gap: 14 (ref 5–15)
BUN: 14 mg/dL (ref 6–20)
CO2: 28 mmol/L (ref 22–32)
Calcium: 9.4 mg/dL (ref 8.9–10.3)
Chloride: 93 mmol/L — ABNORMAL LOW (ref 98–111)
Creatinine, Ser: 1.03 mg/dL (ref 0.61–1.24)
GFR calc Af Amer: >60 mL/min (ref >60)
GFR calc non Af Amer: >60 mL/min (ref >60)
Glucose, Bld: 103 mg/dL — ABNORMAL HIGH (ref 70–99)
Potassium: 2.7 mmol/L — CL (ref 3.5–5.1)
Sodium: 135 mmol/L (ref 135–145)
Total Bilirubin: 0.5 mg/dL (ref 0.3–1.2)
Total Protein: 7.6 g/dL (ref 6.5–8.1)

## 2019-02-12 LAB — CBC
HCT: 46.9 % (ref 39.0–52.0)
Hemoglobin: 16.8 g/dL (ref 13.0–17.0)
MCH: 29.9 pg (ref 26.0–34.0)
MCHC: 35.8 g/dL (ref 30.0–36.0)
MCV: 83.5 fL (ref 80.0–100.0)
Platelets: 249 10*3/uL (ref 150–400)
RBC: 5.62 MIL/uL (ref 4.22–5.81)
RDW: 12.1 % (ref 11.5–15.5)
WBC: 6.1 10*3/uL (ref 4.0–10.5)
nRBC: 0 % (ref 0.0–0.2)

## 2019-02-12 LAB — LIPASE, BLOOD: Lipase: 40 U/L (ref 11–51)

## 2019-02-12 LAB — MAGNESIUM: Magnesium: 1.9 mg/dL (ref 1.7–2.4)

## 2019-02-12 MED ORDER — MAGNESIUM SULFATE 2 GM/50ML IV SOLN
2.0000 g | Freq: Once | INTRAVENOUS | Status: AC
  Administered 2019-02-12: 2 g via INTRAVENOUS
  Filled 2019-02-12: qty 50

## 2019-02-12 MED ORDER — ACETAMINOPHEN 500 MG PO TABS: 500.0000 mg | ORAL_TABLET | Freq: Four times a day (QID) | ORAL | 0 refills | Status: DC | PRN

## 2019-02-12 MED ORDER — POTASSIUM CHLORIDE ER 10 MEQ PO TBCR: 10.0000 meq | EXTENDED_RELEASE_TABLET | Freq: Every day | ORAL | 0 refills | Status: DC

## 2019-02-12 MED ORDER — POTASSIUM CHLORIDE 10 MEQ/100ML IV SOLN
10.0000 meq | INTRAVENOUS | Status: AC
  Administered 2019-02-12 (×2): 10 meq via INTRAVENOUS
  Filled 2019-02-12 (×2): qty 100

## 2019-02-12 MED ORDER — BENZONATATE 100 MG PO CAPS: 100.0000 mg | ORAL_CAPSULE | Freq: Three times a day (TID) | ORAL | 0 refills | Status: DC | PRN

## 2019-02-12 MED ORDER — KETOROLAC TROMETHAMINE 30 MG/ML IJ SOLN
30.0000 mg | Freq: Once | INTRAMUSCULAR | Status: AC
  Administered 2019-02-12: 30 mg via INTRAVENOUS
  Filled 2019-02-12: qty 1

## 2019-02-12 MED ORDER — SODIUM CHLORIDE 0.9% FLUSH: 3.0000 mL | Freq: Once | INTRAVENOUS | Status: DC

## 2019-02-12 MED ORDER — POTASSIUM CHLORIDE CRYS ER 20 MEQ PO TBCR
40.0000 meq | EXTENDED_RELEASE_TABLET | Freq: Once | ORAL | Status: AC
  Administered 2019-02-12: 40 meq via ORAL
  Filled 2019-02-12: qty 2

## 2019-02-12 MED ORDER — ACETAMINOPHEN 325 MG PO TABS
650.0000 mg | ORAL_TABLET | Freq: Once | ORAL | Status: AC
  Administered 2019-02-12: 650 mg via ORAL
  Filled 2019-02-12: qty 2

## 2019-02-12 NOTE — Discharge Instructions (Addendum)
Your potassium today was low.  Please take 1 tablet of potassium daily for the next 4 days beginning tomorrow. Drink plenty of fluids and get plenty of rest.  Take 1 to 2 tablets of Tylenol every 6 hours as needed for fever pain. Take Tessalon as needed for cough.  Your COVID test will result within 48 hours.  You will receive a phone call if your test is positive, no phone call if your test is negative.  You can also see your results on MyChart.  Please quarantine at home for at least 14 days from when your symptoms began.  These are the current recommendations from the [facility].  Return to the emergency department immediately if any concerning signs or symptoms develop such as worsening pain, persistent vomiting, or shortness of breath.  Tu potasio de hoy estaba bajo. [place] prximos 4 das a partir de [place]. Beba muchos lquidos y descanse lo suficiente. Tome de 1 a 2 tabletas de Tylenol cada 6 horas segn sea necesario para el dolor febril. Tome Tessalon segn sea necesario para la tos.  Su prueba de COVID dar como resultado dentro de las 48 horas. Recibir una llamada telefnica si su prueba es positiva, ninguna llamada telefnica si su prueba es negativa. Tambin puede Licensed conveyancer. Ponga en cuarentena en casa durante al menos [address] desde que comenzaron sus sntomas. Estas son las Product/process development scientist.  Regrese al departamento de emergencias de inmediato si se desarrollan signos o sntomas preocupantes, como empeoramiento del dolor, vmitos persistentes o dificultad para respirar.

## 2019-02-12 NOTE — ED Notes (Signed)
Dr. Ralene Bathe notified of pt coming to room 32 and Potassium 2.7

## 2019-02-12 NOTE — ED Triage Notes (Signed)
Pt accompanied by son, spanish speaking. Reports cough, fever and abd pain since yesterday. Denies nausea or vomiting. Pt a.o, resp e.u

## 2019-02-12 NOTE — ED Provider Notes (Signed)
Brett Jones is a 58 y.o. male, presenting to the ED with fever, cough, and epigastric pain.  Patient had no complaints upon my interaction with him.  Spanish interpreter was used. Denies tobacco, illicit drug, or alcohol use.   HPI from [facility], PA-C: "Brett Jones is a 58 y.o. male with history of hypertension, vertigo presents for evaluation of acute onset, persistent and progressively worsening fever, cough, abdominal pain since yesterday.  Reports he is unsure what his temperature has been at home.  Notes mostly epigastric abdominal pain which he describes as a throbbing sensation which will radiate all over and to the back with bending.  Notes cough productive of green sputum.  Denies chest pain or shortness of breath.  Denies nausea, vomiting, diarrhea, constipation, urinary symptoms.  No known sick contacts, no known COVID exposures.  He is a non-smoker.  He is primarily Spanish-speaking and a Optometrist was used throughout the encounter.  Has been taking Tylenol with some relief of symptoms."  Past Medical History:  Diagnosis Date  . Hypertension   . Vertigo    Physical Exam  BP 129/86   Pulse 63   Temp 99.5 F (37.5 C) (Oral)   Resp 15   Ht 5\' 7"  (1.702 m)   Wt 86.2 kg   SpO2 98%   BMI 29.76 kg/m   Physical Exam Vitals signs and nursing note reviewed.  Constitutional:      General: He is not in acute distress.    Appearance: He is well-developed. He is not diaphoretic.  HENT:     Head: Normocephalic and atraumatic.     Mouth/Throat:     Mouth: Mucous membranes are moist.     Pharynx: Oropharynx is clear.  Eyes:     Conjunctiva/sclera: Conjunctivae normal.  Neck:     Musculoskeletal: Neck supple.  Cardiovascular:     Rate and Rhythm: Normal rate and regular rhythm.     Pulses: Normal pulses.          Radial pulses are 2+ on the right side and 2+ on the left side.       Posterior tibial pulses are 2+ on the right side and 2+ on the left side.      Heart sounds: Normal heart sounds.     Comments: Tactile temperature in the extremities appropriate and equal bilaterally. Pulmonary:     Effort: Pulmonary effort is normal. No respiratory distress.     Breath sounds: Normal breath sounds.     Comments: No increased work of breathing.  Speaks in full sentences without difficulty. Abdominal:     Palpations: Abdomen is soft.     Tenderness: There is no abdominal tenderness. There is no guarding.  Musculoskeletal:     Right lower leg: No edema.     Left lower leg: No edema.  Lymphadenopathy:     Cervical: No cervical adenopathy.  Skin:    General: Skin is warm and dry.  Neurological:     Mental Status: He is alert.  Psychiatric:        Mood and Affect: Mood and affect normal.        Speech: Speech normal.        Behavior: Behavior normal.      ED Course/Procedures    Procedures   Abnormal Labs Reviewed  COMPREHENSIVE METABOLIC PANEL - Abnormal; Notable for the following components:      Result Value   Potassium 2.7 (*)    Chloride 93 (*)  Glucose, Bld 103 (*)    All other components within normal limits  URINALYSIS, ROUTINE W REFLEX MICROSCOPIC - Abnormal; Notable for the following components:   Color, Urine STRAW (*)    All other components within normal limits    Dg Chest Portable 1 View  Result Date: 02/12/2019 CLINICAL DATA:  58 year old male shortness of breath fever and cough. EXAM: PORTABLE CHEST 1 VIEW COMPARISON:  Chest radiographs 02/06/2017 and earlier. FINDINGS: Portable AP semi upright view at 2135 hours. Slightly lower lung volumes. Mediastinal contours are stable and within normal limits. Allowing for portable technique the lungs are clear. Visualized tracheal air column is within normal limits. Negative visible bowel gas pattern and osseous structures. IMPRESSION: Negative portable chest. Electronically Signed   By: Odessa Fleming M.D.   On: 02/12/2019 21:46             MDM   Clinical Course as  of Feb 13 156  Thu Feb 13, 2019  0147 No complaints. Tolerating PO.   [SJ]    Clinical Course User Index [SJ] Anselm Pancoast, PA-C     Patient care handoff report received from [facility], [place]. Plan: Finish potassium repletion.  Reexamine.  P.o. challenge.  Discharge.  Patient presents with fever, cough, and epigastric discomfort.  Patient is nontoxic appearing, not tachycardic, not tachypneic, not hypotensive, maintains excellent SPO2 on room air, and is in no apparent distress.  No leukocytosis. He had no complaints during my time with him.  He was able to tolerate p.o. fluids. Hypokalemia noted and addressed.  He will continue this outpatient.  He should follow-up with a PCP for continued management.  Resources given. COVID test pending.  Patient voices understanding for quarantine instructions.  The patient was given instructions for home care as well as return precautions. Patient voices understanding of these instructions, accepts the plan, and is comfortable with discharge.  Vitals:   02/12/19 2200 02/12/19 2215 02/12/19 2230 02/12/19 2300  BP: 137/88 121/74 120/74 129/86  Pulse: 71 61 (!) 59 63  Resp: 18 15 18 15   Temp:      TempSrc:      SpO2: 96% 95% 96% 98%  Weight:      Height:       Vitals:   02/12/19 2300 02/13/19 0000 02/13/19 0030 02/13/19 0100  BP: 129/86 136/82 119/78 109/73  Pulse: 63 65 64 (!) 56  Resp: 15 16 13 14   Temp:      TempSrc:      SpO2: 98% 98% 98% 93%  Weight:      Height:                02/15/19, PA-C 02/13/19 0200    Anselm Pancoast, MD 02/13/19 [phone]

## 2019-02-12 NOTE — ED Provider Notes (Addendum)
[facility] EMERGENCY DEPARTMENT Provider Note   CSN: [id number] Arrival date & time: 02/12/19  1847     History   Chief Complaint Chief Complaint  Patient presents with  . Cough  . Fever  . Abdominal Pain    HPI Brett Jones is a 58 y.o. male with history of hypertension, vertigo presents for evaluation of acute onset, persistent and progressively worsening fever, cough, abdominal pain since yesterday.  Reports he is unsure what his temperature has been at home.  Notes mostly epigastric abdominal pain which he describes as a throbbing sensation which will radiate all over and to the back with bending.  Notes cough productive of green sputum.  Denies chest pain or shortness of breath.  Denies nausea, vomiting, diarrhea, constipation, urinary symptoms.  No known sick contacts, no known COVID exposures.  He is a non-smoker.  He is primarily Spanish-speaking and a Nurse, learning disability was used throughout the encounter.  Has been taking Tylenol with some relief of symptoms.     The history is provided by the patient. The history is limited by a language barrier. A language interpreter was used.    Past Medical History:  Diagnosis Date  . Hypertension   . Vertigo     Patient Active Problem List   Diagnosis Date Noted  . 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 Hypertension     History reviewed. No pertinent surgical history.      Home Medications    Prior to Admission medications   Medication Sig Start Date End Date Taking? Authorizing Provider  acetaminophen (TYLENOL) 500 MG tablet Take 1 tablet (500 mg total) by mouth every 6 (six) hours as needed. 02/12/19   Krystian Younglove A, PA-C  amLODipine (NORVASC) 10 MG tablet Take 10 mg by mouth daily.     [provider]  benzonatate (TESSALON) 100 MG capsule Take 1 capsule (100 mg total) by mouth 3 (three) times daily as needed for cough. 02/12/19   Abdulkadir Emmanuel A, PA-C  cyclobenzaprine (FLEXERIL) 10 MG  tablet Take 1 tablet (10 mg total) by mouth 2 (two) times daily as needed for muscle spasms. 09/19/16   Deatra Canter, FNP  meclizine (ANTIVERT) 25 MG tablet Take 1 tablet (25 mg total) by mouth 3 (three) times daily as needed. 06/11/16   Rolland Porter, MD  naproxen (NAPROSYN) 500 MG tablet Take 1 tablet (500 mg total) by mouth 2 (two) times daily with a meal. 09/19/16   [place], Anselm Pancoast, FNP  potassium chloride (KLOR-CON) 10 MEQ tablet Take 1 tablet (10 mEq total) by mouth daily for 4 days. 02/12/19 02/16/19  Zacchaeus Halm A, PA-C  valsartan-hydrochlorothiazide (DIOVAN-HCT) 320-25 MG tablet Take 1 tablet by mouth daily.    [provider]    Family History No family history on file.  Social History Social History   Tobacco Use  . Smoking status: Never Smoker  . Smokeless tobacco: Never Used  Substance Use Topics  . Alcohol use: No  . Drug use: No     Allergies   Patient has no known allergies.   Review of Systems Review of Systems  Constitutional: Positive for chills and fever.  Respiratory: Positive for cough. Negative for shortness of breath.   Cardiovascular: Negative for chest pain.  Gastrointestinal: Positive for abdominal pain. Negative for constipation, diarrhea, nausea and vomiting.  Genitourinary: Negative for dysuria, frequency, hematuria and urgency.  All other systems reviewed and are negative.    Physical Exam Updated Vital Signs  BP 129/86   Pulse 63   Temp 99.5 F (37.5 C) (Oral)   Resp 15   Ht 5\' 7"  (1.702 m)   Wt 86.2 kg   SpO2 98%   BMI 29.76 kg/m   Physical Exam Vitals signs and nursing note reviewed.  Constitutional:      General: He is not in acute distress.    Appearance: He is well-developed.     Comments: Resting comfortably in bed  HENT:     Head: Normocephalic and atraumatic.  Eyes:     General:        Right eye: No discharge.        Left eye: No discharge.     Conjunctiva/sclera: Conjunctivae normal.  Neck:      Vascular: No JVD.     Trachea: No tracheal deviation.  Cardiovascular:     Rate and Rhythm: Normal rate.  Pulmonary:     Effort: Pulmonary effort is normal.  Abdominal:     General: Abdomen is flat. There is no distension.     Palpations: Abdomen is soft.     Tenderness: There is abdominal tenderness in the epigastric area. There is no guarding or rebound. Negative signs include Murphy's sign.  Skin:    General: Skin is warm and dry.     Findings: No erythema.  Neurological:     Mental Status: He is alert.  Psychiatric:        Behavior: Behavior normal.      ED Treatments / Results  Labs (all labs ordered are listed, but only abnormal results are displayed) Labs Reviewed  COMPREHENSIVE METABOLIC PANEL - Abnormal; Notable for the following components:      Result Value   Potassium 2.7 (*)    Chloride 93 (*)    Glucose, Bld 103 (*)    All other components within normal limits  NOVEL CORONAVIRUS, NAA (HOSP ORDER, SEND-OUT TO REF LAB; TAT 18-24 HRS)  LIPASE, BLOOD  CBC  MAGNESIUM  URINALYSIS, ROUTINE W REFLEX MICROSCOPIC    EKG ED ECG REPORT   Date: 02/12/2019  Rate: 65   Rhythm: normal sinus rhythm  QRS Axis: normal  Intervals: normal  ST/T Wave abnormalities: nonspecific T wave changes  Conduction Disutrbances:none  Narrative Interpretation:   Old EKG Reviewed: changes noted; decreased amplitude of T waves diffusely today compared to the prior study.  I have personally reviewed the EKG tracing and agree with the computerized printout as noted.   Radiology Dg Chest Portable 1 View  Result Date: 02/12/2019 CLINICAL DATA:  58 year old male shortness of breath fever and cough. EXAM: PORTABLE CHEST 1 VIEW COMPARISON:  Chest radiographs 02/06/2017 and earlier. FINDINGS: Portable AP semi upright view at 2135 hours. Slightly lower lung volumes. Mediastinal contours are stable and within normal limits. Allowing for portable technique the lungs are clear. Visualized  tracheal air column is within normal limits. Negative visible bowel gas pattern and osseous structures. IMPRESSION: Negative portable chest. Electronically Signed   By: Genevie Ann M.D.   On: 02/12/2019 21:46    Procedures Procedures (including critical care time)  Medications Ordered in ED Medications  sodium chloride flush (NS) 0.9 % injection 3 mL (3 mLs Intravenous Not Given 02/12/19 2136)  potassium chloride 10 mEq in 100 mL IVPB (10 mEq Intravenous New Bag/Given 02/12/19 2308)  acetaminophen (TYLENOL) tablet 650 mg (650 mg Oral Given 02/12/19 1912)  potassium chloride SA (KLOR-CON) CR tablet 40 mEq (40 mEq Oral Given 02/12/19 2218)  magnesium sulfate  IVPB 2 g 50 mL (0 g Intravenous Stopped 02/12/19 2303)  ketorolac (TORADOL) 30 MG/ML injection 30 mg (30 mg Intravenous Given 02/12/19 2358)     Initial Impression / Assessment and Plan / ED Course  I have reviewed the triage vital signs and the nursing notes.  Pertinent labs & imaging results that were available during my care of the patient were reviewed by me and considered in my medical decision making (see chart for details).        Brett Jones was evaluated in Emergency Department on 02/13/2019 for the symptoms described in the history of present illness. He was evaluated in the context of the global COVID-19 pandemic, which necessitated consideration that the patient might be at risk for infection with the SARS-CoV-2 virus that causes COVID-19. Institutional protocols and algorithms that pertain to the evaluation of patients at risk for COVID-19 are in a state of rapid change based on information released by regulatory bodies including the CDC and federal and state organizations. These policies and algorithms were followed during the patient's care in the ED.  Patient presenting today for evaluation of fever, productive cough, epigastric abdominal pain since yesterday.  He is initially febrile, vital signs otherwise  stable.  SPO2 saturations are within normal limits and he is speaking in full sentences without difficulty, no evidence of respiratory distress.  Chest x-ray shows no acute cardiopulmonary abnormalities with no evidence of pneumonia or pleural effusion.  EKG shows no ST segment abnormalities or arrhythmia but he does have decreased amplitude of T waves compared to prior EKG which can be seen in the setting of hypokalemia.  His QTC is within normal limits.  He denies any chest pain and I have a low suspicion of ACS/MI.  Lab work reviewed by me shows no leukocytosis, no anemia, no renal insufficiency.  His LFTs and lipase are within normal limits.  Potassium is low at 2.7, will obtain magnesium and give supplemental potassium IV and p.o.  He is tolerating p.o. at home.  Examination of the abdomen is fairly benign with no peritoneal signs.  Doubt acute surgical abdominal pathology in the absence of severe focal tenderness, nausea, vomiting, diarrhea, or urinary symptoms.  12:00 AM Signed out care to oncoming provider PA Joy. Pending UA, PO challenge, reassessment. If he is resting comfortably in no distress and is tolerating PO, likely stable for discharge home with PO potassium for a few days and outpatient COVID test. He does not wish to be admitted to the hospital despite his hypokalemia with mild EKD changes due to financial constraints.  I think this is reasonable as he is overall quite well-appearing, his EKG changes are quite mild and he is tolerating p.o. so his potassium can be replenished orally.  If he is unable to tolerate p.o., his pain worsens or his examination is concerning then he may require admission to the hospital.  Discussed with Dr. Madilyn Hook who agrees with assessment and plan at this time.  Final Clinical Impressions(s) / ED Diagnoses   Final diagnoses:  Suspected COVID-19 virus infection  Hypokalemia    ED Discharge Orders         Ordered    potassium chloride (KLOR-CON) 10 MEQ  tablet  Daily     02/12/19 2351    acetaminophen (TYLENOL) 500 MG tablet  Every 6 hours PRN     02/12/19 2351    benzonatate (TESSALON) 100 MG capsule  3 times daily PRN     02/12/19 2352  Jeanie SewerFawze, Alaycia Eardley A, PA-C 02/13/19 0000    Tilden Fossaees, Elizabeth, MD 02/15/19 1122

## 2019-02-13 LAB — URINALYSIS, ROUTINE W REFLEX MICROSCOPIC
Bilirubin Urine: NEGATIVE
Glucose, UA: NEGATIVE mg/dL
Hgb urine dipstick: NEGATIVE
Ketones, ur: NEGATIVE mg/dL
Leukocytes,Ua: NEGATIVE
Nitrite: NEGATIVE
Protein, ur: NEGATIVE mg/dL
Specific Gravity, Urine: 1.005 (ref 1.005–1.030)
pH: 7 (ref 5.0–8.0)

## 2019-02-17 LAB — NOVEL CORONAVIRUS, NAA (HOSP ORDER, SEND-OUT TO REF LAB; TAT 18-24 HRS): SARS-CoV-2, NAA: DETECTED — AB

## 2019-09-23 ENCOUNTER — Encounter (HOSPITAL_COMMUNITY): Payer: Self-pay | Admitting: *Deleted

## 2019-09-23 ENCOUNTER — Emergency Department (HOSPITAL_COMMUNITY)
Admission: EM | Admit: 2019-09-23 | Discharge: 2019-09-23 | Disposition: A | Discharge status: Home / Self Care | Payer: Self-pay | Attending: Emergency Medicine | Admitting: Emergency Medicine

## 2019-09-23 ENCOUNTER — Other Ambulatory Visit: Payer: Self-pay

## 2019-09-23 ENCOUNTER — Emergency Department (HOSPITAL_COMMUNITY): Payer: Self-pay

## 2019-09-23 DIAGNOSIS — E876 Hypokalemia: Secondary | ICD-10-CM | POA: Insufficient documentation

## 2019-09-23 DIAGNOSIS — K5900 Constipation, unspecified: Secondary | ICD-10-CM | POA: Insufficient documentation

## 2019-09-23 DIAGNOSIS — R109 Unspecified abdominal pain: Secondary | ICD-10-CM

## 2019-09-23 DIAGNOSIS — Z79899 Other long term (current) drug therapy: Secondary | ICD-10-CM | POA: Insufficient documentation

## 2019-09-23 DIAGNOSIS — I10 Essential (primary) hypertension: Secondary | ICD-10-CM | POA: Insufficient documentation

## 2019-09-23 LAB — CBC
HCT: 49.1 % (ref 39.0–52.0)
Hemoglobin: 17.2 g/dL — ABNORMAL HIGH (ref 13.0–17.0)
MCH: 29.6 pg (ref 26.0–34.0)
MCHC: 35 g/dL (ref 30.0–36.0)
MCV: 84.4 fL (ref 80.0–100.0)
Platelets: 346 10*3/uL (ref 150–400)
RBC: 5.82 MIL/uL — ABNORMAL HIGH (ref 4.22–5.81)
RDW: 11.9 % (ref 11.5–15.5)
WBC: 7.9 10*3/uL (ref 4.0–10.5)
nRBC: 0 % (ref 0.0–0.2)

## 2019-09-23 LAB — COMPREHENSIVE METABOLIC PANEL
ALT: 23 U/L (ref 0–44)
AST: 24 U/L (ref 15–41)
Albumin: 3.9 g/dL (ref 3.5–5.0)
Alkaline Phosphatase: 70 U/L (ref 38–126)
Anion gap: 12 (ref 5–15)
BUN: 18 mg/dL (ref 6–20)
CO2: 32 mmol/L (ref 22–32)
Calcium: 9.8 mg/dL (ref 8.9–10.3)
Chloride: 95 mmol/L — ABNORMAL LOW (ref 98–111)
Creatinine, Ser: 1.09 mg/dL (ref 0.61–1.24)
GFR calc Af Amer: >60 mL/min (ref >60)
GFR calc non Af Amer: >60 mL/min (ref >60)
Glucose, Bld: 126 mg/dL — ABNORMAL HIGH (ref 70–99)
Potassium: 2.9 mmol/L — ABNORMAL LOW (ref 3.5–5.1)
Sodium: 139 mmol/L (ref 135–145)
Total Bilirubin: 0.6 mg/dL (ref 0.3–1.2)
Total Protein: 7.6 g/dL (ref 6.5–8.1)

## 2019-09-23 LAB — URINALYSIS, ROUTINE W REFLEX MICROSCOPIC
Bilirubin Urine: NEGATIVE
Glucose, UA: NEGATIVE mg/dL
Hgb urine dipstick: NEGATIVE
Ketones, ur: NEGATIVE mg/dL
Leukocytes,Ua: NEGATIVE
Nitrite: NEGATIVE
Protein, ur: NEGATIVE mg/dL
Specific Gravity, Urine: 1.021 (ref 1.005–1.030)
pH: 6 (ref 5.0–8.0)

## 2019-09-23 LAB — LIPASE, BLOOD: Lipase: 37 U/L (ref 11–51)

## 2019-09-23 MED ORDER — POTASSIUM CHLORIDE ER 10 MEQ PO TBCR: 10.0000 meq | EXTENDED_RELEASE_TABLET | Freq: Two times a day (BID) | ORAL | 0 refills | Status: DC

## 2019-09-23 MED ORDER — SODIUM CHLORIDE 0.9% FLUSH: 3.0000 mL | Freq: Once | INTRAVENOUS | Status: DC

## 2019-09-23 MED ORDER — POLYETHYLENE GLYCOL 3350 17 G PO PACK: 17.0000 g | PACK | Freq: Every day | ORAL | 0 refills | Status: DC | PRN

## 2019-09-23 NOTE — ED Triage Notes (Signed)
Son states that pt has not had a bowel movement in a week and reports lower abdominal pain. No problems eating or drinking. This is the first time this has happened and feels like stomach is hard.

## 2019-09-23 NOTE — ED Provider Notes (Signed)
[facility] EMERGENCY DEPARTMENT Provider Note   CSN: [id number] Arrival date & time: 09/23/19  1214     History Chief Complaint  Patient presents with  . Abdominal Pain    Brett Jones is a 59 y.o. male. Patient speaks Spanish primarily and was translated by family member.  Medical interpreter offered and refused. HPI Patient presents with abdominal pain and constipation.  States he has not had a bowel movement in about a week.  Still passing gas.  States his abdomen is more swollen.  No fevers.  No dysuria.  States hard to go bathroom.  States he feels the food stuck in his rear end.  No nausea or vomiting.  States he has lost a little weight.  No fevers or chills.  No previous abdominal surgery.  Does not smoke.  No swelling in his legs.    Past Medical History:  Diagnosis Date  . Hypertension   . Vertigo     Patient Active Problem List   Diagnosis Date Noted  . 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 Hypertension     History reviewed. No pertinent surgical history.     No family history on file.  Social History   Tobacco Use  . Smoking status: Never Smoker  . Smokeless tobacco: Never Used  Substance Use Topics  . Alcohol use: No  . Drug use: No    Home Medications Prior to Admission medications   Medication Sig Start Date End Date Taking? Authorizing Provider  acetaminophen (TYLENOL) 500 MG tablet Take 1 tablet (500 mg total) by mouth every 6 (six) hours as needed. Patient taking differently: Take 500 mg by mouth every 6 (six) hours as needed for mild pain or moderate pain.  02/12/19  Yes Fawze, Mina A, PA-C  amLODipine (NORVASC) 10 MG tablet Take 10 mg by mouth daily.    Yes [provider]  benzonatate (TESSALON) 100 MG capsule Take 1 capsule (100 mg total) by mouth 3 (three) times daily as needed for cough. 02/12/19  Yes Fawze, Mina A, PA-C  chlorthalidone (HYGROTON) 50 MG tablet Take 50 mg by mouth daily.   Yes  [provider]  predniSONE (DELTASONE) 10 MG tablet Take 10 mg by mouth daily with breakfast.   Yes [provider]  cyclobenzaprine (FLEXERIL) 10 MG tablet Take 1 tablet (10 mg total) by mouth 2 (two) times daily as needed for muscle spasms. Patient not taking: Reported on 09/23/2019 09/19/16   Lysbeth Penner, FNP  meclizine (ANTIVERT) 25 MG tablet Take 1 tablet (25 mg total) by mouth 3 (three) times daily as needed. Patient not taking: Reported on 09/23/2019 06/11/16   Tanna Furry, MD  naproxen (NAPROSYN) 500 MG tablet Take 1 tablet (500 mg total) by mouth 2 (two) times daily with a meal. Patient not taking: Reported on 09/23/2019 09/19/16   Lysbeth Penner, FNP  polyethylene glycol (MIRALAX) 17 g packet Take 17 g by mouth daily as needed for moderate constipation (May take 2 doses a day if not improving.). 09/23/19   Davonna Belling, MD  potassium chloride (KLOR-CON) 10 MEQ tablet Take 1 tablet (10 mEq total) by mouth 2 (two) times daily for 4 days. 09/23/19 09/27/19  Davonna Belling, MD    Allergies    Patient has no known allergies.  Review of Systems   Review of Systems  Constitutional: Positive for unexpected weight change. Negative for appetite change.  HENT: Negative for congestion.   Gastrointestinal: Positive for abdominal distention,  abdominal pain and constipation. Negative for nausea and vomiting.  Genitourinary: Negative for flank pain.  Musculoskeletal: Negative for arthralgias.  Neurological: Negative for weakness.  Psychiatric/Behavioral: Negative for confusion.    Physical Exam Updated Vital Signs BP 130/85 (BP Location: Right Arm)   Pulse (!) 56   Temp 98.4 F (36.9 C) (Oral)   Resp 14   Ht 5\' 7"  (1.702 m)   Wt 81.6 kg   SpO2 98%   BMI 28.19 kg/m   Physical Exam Vitals and nursing note reviewed.  HENT:     Head: Normocephalic.  Cardiovascular:     Rate and Rhythm: Normal rate.  Pulmonary:     Breath sounds: Normal breath sounds.   Abdominal:     Comments: Mild distention.  Mild diffuse tenderness.  No hernia palpated.  No mass palpated.  Skin:    General: Skin is warm.     Capillary Refill: Capillary refill takes less than 2 seconds.  Neurological:     Mental Status: He is alert and oriented to person, place, and time.     ED Results / Procedures / Treatments   Labs (all labs ordered are listed, but only abnormal results are displayed) Labs Reviewed  COMPREHENSIVE METABOLIC PANEL - Abnormal; Notable for the following components:      Result Value   Potassium 2.9 (*)    Chloride 95 (*)    Glucose, Bld 126 (*)    All other components within normal limits  CBC - Abnormal; Notable for the following components:   RBC 5.82 (*)    Hemoglobin 17.2 (*)    All other components within normal limits  LIPASE, BLOOD  URINALYSIS, ROUTINE W REFLEX MICROSCOPIC    EKG None  Radiology DG Abd 2 Views  Result Date: 09/23/2019 CLINICAL DATA:  Constipation x1 week. EXAM: ABDOMEN - 2 VIEW COMPARISON:  None. FINDINGS: The bowel gas pattern is normal. There is no evidence of free air. A large amount of stool is seen throughout the colon. No radio-opaque calculi or other significant radiographic abnormality is seen. IMPRESSION: Large stool burden without evidence of bowel obstruction. Electronically Signed   By: 09/25/2019 M.D.   On: 09/23/2019 18:07    Procedures Procedures (including critical care time)  Medications Ordered in ED Medications  sodium chloride flush (NS) 0.9 % injection 3 mL (has no administration in time range)    ED Course  I have reviewed the triage vital signs and the nursing notes.  Pertinent labs & imaging results that were available during my care of the patient were reviewed by me and considered in my medical decision making (see chart for details).    MDM Rules/Calculators/A&P                      Patient with abdominal pain and constipation.  X-ray does show constipation.  Rather  benign abdominal exam but does have mild distention.  Mild hypokalemia will be treated.  Patient refused rectal exam.  Discussed further imaging now versus conservative treatment with MiraLAX and follow-up as an outpatient.  Patient has elected to do this.  Will discharge home.  Informed of potential causes of this constipation even such as malignancy. Final Clinical Impression(s) / ED Diagnoses Final diagnoses:  Constipation  Abdominal pain, unspecified abdominal location  Hypokalemia    Rx / DC Orders ED Discharge Orders         Ordered    polyethylene glycol (MIRALAX) 17 g packet  Daily PRN     09/23/19 1853    potassium chloride (KLOR-CON) 10 MEQ tablet  2 times daily     09/23/19 1854           Benjiman Core, MD 09/23/19 1858

## 2020-07-19 ENCOUNTER — Emergency Department (HOSPITAL_COMMUNITY)
Admission: EM | Admit: 2020-07-19 | Discharge: 2020-07-19 | Disposition: A | Discharge status: Home / Self Care | Payer: Self-pay | Attending: Emergency Medicine | Admitting: Emergency Medicine

## 2020-07-19 ENCOUNTER — Other Ambulatory Visit: Payer: Self-pay

## 2020-07-19 ENCOUNTER — Emergency Department (HOSPITAL_COMMUNITY): Payer: Self-pay

## 2020-07-19 ENCOUNTER — Encounter (HOSPITAL_COMMUNITY): Payer: Self-pay | Admitting: Emergency Medicine

## 2020-07-19 DIAGNOSIS — H6121 Impacted cerumen, right ear: Secondary | ICD-10-CM | POA: Insufficient documentation

## 2020-07-19 DIAGNOSIS — I10 Essential (primary) hypertension: Secondary | ICD-10-CM | POA: Insufficient documentation

## 2020-07-19 DIAGNOSIS — R519 Headache, unspecified: Secondary | ICD-10-CM

## 2020-07-19 DIAGNOSIS — Z79899 Other long term (current) drug therapy: Secondary | ICD-10-CM | POA: Insufficient documentation

## 2020-07-19 LAB — CBC
HCT: 50.2 % (ref 39.0–52.0)
Hemoglobin: 17.9 g/dL — ABNORMAL HIGH (ref 13.0–17.0)
MCH: 29.5 pg (ref 26.0–34.0)
MCHC: 35.7 g/dL (ref 30.0–36.0)
MCV: 82.7 fL (ref 80.0–100.0)
Platelets: 331 10*3/uL (ref 150–400)
RBC: 6.07 MIL/uL — ABNORMAL HIGH (ref 4.22–5.81)
RDW: 12.4 % (ref 11.5–15.5)
WBC: 9.5 10*3/uL (ref 4.0–10.5)
nRBC: 0 % (ref 0.0–0.2)

## 2020-07-19 LAB — BASIC METABOLIC PANEL
Anion gap: 10 (ref 5–15)
BUN: 15 mg/dL (ref 6–20)
CO2: 32 mmol/L (ref 22–32)
Calcium: 10.1 mg/dL (ref 8.9–10.3)
Chloride: 95 mmol/L — ABNORMAL LOW (ref 98–111)
Creatinine, Ser: 1.04 mg/dL (ref 0.61–1.24)
GFR, Estimated: >60 mL/min (ref >60)
Glucose, Bld: 133 mg/dL — ABNORMAL HIGH (ref 70–99)
Potassium: 3 mmol/L — ABNORMAL LOW (ref 3.5–5.1)
Sodium: 137 mmol/L (ref 135–145)

## 2020-07-19 MED ORDER — POTASSIUM CHLORIDE ER 10 MEQ PO TBCR
20.0000 meq | EXTENDED_RELEASE_TABLET | Freq: Every day | ORAL | 0 refills | Status: DC
Start: 2020-07-19 — End: 2021-07-05

## 2020-07-19 MED ORDER — IOHEXOL 350 MG/ML SOLN
75.0000 mL | Freq: Once | INTRAVENOUS | Status: AC | PRN
  Administered 2020-07-19: 75 mL via INTRAVENOUS

## 2020-07-19 MED ORDER — POTASSIUM CHLORIDE CRYS ER 20 MEQ PO TBCR
40.0000 meq | EXTENDED_RELEASE_TABLET | Freq: Once | ORAL | Status: AC
  Administered 2020-07-19: 40 meq via ORAL
  Filled 2020-07-19: qty 2

## 2020-07-19 NOTE — ED Provider Notes (Signed)
[facility] EMERGENCY DEPARTMENT Provider Note   CSN: [id number] Arrival date & time: 07/19/20  1006     History Chief Complaint  Patient presents with  . Tinnitus    Brett Jones is a 60 y.o. male with prior past medical history of hypertension, vertigo that presents emerge department today for tinnitus.  Patient is Spanish-speaking, Spanish medical interpreter was used.  Patient states that for the past year he has had persistent tinnitus in his right ear, denies any ear pain or tenderness on his left ear.  Patient states he also has occasional dizziness from this, has never syncopized.  Patient states that he also has intermittent headaches, last one was yesterday. Gradual onset.  States that he came into the emerge department today because he had a headache yesterday, has resolved.  Denies any trauma.  Has never seen ENT.  Has history of vertigo in his chart, does not take anything for this.  Denies any new medications, not on aspirin.  No trauma to the head.  Denies any dizziness currently.  Denies any abnormal gait.  Denies any fevers, neck pain, vision changes.  However patient does mention that he thinks he needs to get this glasses changed because for the past couple of months he feels as if his vision is slightly blurry faraway.  Denies any vision loss.  Denies any otorrhea, recent swimming, altitude change.  Denies any hearing loss.  Denies any jaw pain.  States that at times he does feel slightly nauseous, does not vomit.  Dizziness does not last long when it comes, has not been dizzy for a while.  Is unable to describe this for me.  Only new medication is amlodipine.  Has not been evaluated by PCP for this.  HPI     Past Medical History:  Diagnosis Date  . Hypertension   . Vertigo     Patient Active Problem List   Diagnosis Date Noted  . 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 Hypertension     History reviewed. No pertinent surgical  history.     No family history on file.  Social History   Tobacco Use  . Smoking status: Never Smoker  . Smokeless tobacco: Never Used  Vaping Use  . Vaping Use: Never used  Substance Use Topics  . Alcohol use: No  . Drug use: No    Home Medications Prior to Admission medications   Medication Sig Start Date End Date Taking? Authorizing Provider  potassium chloride (KLOR-CON) 10 MEQ tablet Take 2 tablets (20 mEq total) by mouth daily for 10 days. 07/19/20 07/29/20 Yes Patel, Donne Anon, PA-C  acetaminophen (TYLENOL) 500 MG tablet Take 1 tablet (500 mg total) by mouth every 6 (six) hours as needed. Patient taking differently: Take 500 mg by mouth every 6 (six) hours as needed for mild pain or moderate pain.  02/12/19   Fawze, Mina A, PA-C  amLODipine (NORVASC) 10 MG tablet Take 10 mg by mouth daily.     [provider]  benzonatate (TESSALON) 100 MG capsule Take 1 capsule (100 mg total) by mouth 3 (three) times daily as needed for cough. 02/12/19   Fawze, Mina A, PA-C  chlorthalidone (HYGROTON) 50 MG tablet Take 50 mg by mouth daily.    [provider]  cyclobenzaprine (FLEXERIL) 10 MG tablet Take 1 tablet (10 mg total) by mouth 2 (two) times daily as needed for muscle spasms. Patient not taking: Reported on 09/23/2019 09/19/16   Deatra Canter, FNP  meclizine (ANTIVERT) 25 MG tablet Take 1 tablet (25 mg total) by mouth 3 (three) times daily as needed. Patient not taking: Reported on 09/23/2019 06/11/16   Rolland Porter, MD  naproxen (NAPROSYN) 500 MG tablet Take 1 tablet (500 mg total) by mouth 2 (two) times daily with a meal. Patient not taking: Reported on 09/23/2019 09/19/16   Deatra Canter, FNP  polyethylene glycol (MIRALAX) 17 g packet Take 17 g by mouth daily as needed for moderate constipation (May take 2 doses a day if not improving.). 09/23/19   Benjiman Core, MD  predniSONE (DELTASONE) 10 MG tablet Take 10 mg by mouth daily with breakfast.    [provider]    Allergies    Patient has no known allergies.  Review of Systems   Review of Systems  Constitutional: Negative for chills, diaphoresis, fatigue and fever.  HENT: Positive for tinnitus. Negative for congestion, ear discharge, ear pain, facial swelling, hearing loss, nosebleeds, rhinorrhea, sinus pressure, sore throat, trouble swallowing and voice change.   Eyes: Negative for pain and visual disturbance.  Respiratory: Negative for cough, shortness of breath and wheezing.   Cardiovascular: Negative for chest pain, palpitations and leg swelling.  Gastrointestinal: Negative for abdominal distention, abdominal pain, diarrhea, nausea and vomiting.  Genitourinary: Negative for difficulty urinating.  Musculoskeletal: Negative for back pain, neck pain and neck stiffness.  Skin: Negative for pallor.  Neurological: Positive for dizziness and light-headedness. Negative for speech difficulty, weakness and headaches.  Psychiatric/Behavioral: Negative for confusion.    Physical Exam Updated Vital Signs BP (!) 153/86 (BP Location: Left Arm)   Pulse 78   Temp 97.8 F (36.6 C) (Oral)   Resp 14   SpO2 98%   Physical Exam Constitutional:      General: He is not in acute distress.    Appearance: Normal appearance. He is not ill-appearing, toxic-appearing or diaphoretic.  HENT:     Right Ear: There is impacted cerumen.     Left Ear: There is impacted cerumen (worse than R).     Nose: No congestion.     Mouth/Throat:     Mouth: Mucous membranes are moist.     Pharynx: Oropharynx is clear.  Eyes:     General: No scleral icterus.    Extraocular Movements: Extraocular movements intact.     Pupils: Pupils are equal, round, and reactive to light.  Neck:     Vascular: No carotid bruit.  Cardiovascular:     Rate and Rhythm: Normal rate and regular rhythm.     Pulses: Normal pulses.     Heart sounds: Normal heart sounds.  Pulmonary:     Effort: Pulmonary effort is normal. No  respiratory distress.     Breath sounds: Normal breath sounds. No stridor. No wheezing, rhonchi or rales.  Chest:     Chest wall: No tenderness.  Abdominal:     General: Abdomen is flat. There is no distension.     Palpations: Abdomen is soft.     Tenderness: There is no abdominal tenderness. There is no guarding or rebound.  Musculoskeletal:        General: No swelling or tenderness. Normal range of motion.     Cervical back: Normal range of motion and neck supple. No rigidity.     Right lower leg: No edema.     Left lower leg: No edema.  Skin:    General: Skin is warm and dry.     Capillary Refill: Capillary refill takes less  than 2 seconds.     Coloration: Skin is not pale.  Neurological:     General: No focal deficit present.     Mental Status: He is alert and oriented to person, place, and time.     Comments: Alert. Clear speech. No facial droop. CNIII-XII grossly intact. Bilateral upper and lower extremities' sensation grossly intact. 5/5 symmetric strength with grip strength and with plantar and dorsi flexion bilaterallyNormal finger to nose bilaterally. Negative pronator drift. Negative Romberg sign. Gait is steady and intact    Psychiatric:        Mood and Affect: Mood normal.        Behavior: Behavior normal.     ED Results / Procedures / Treatments   Labs (all labs ordered are listed, but only abnormal results are displayed) Labs Reviewed  BASIC METABOLIC PANEL - Abnormal; Notable for the following components:      Result Value   Potassium 3.0 (*)    Chloride 95 (*)    Glucose, Bld 133 (*)    All other components within normal limits  CBC - Abnormal; Notable for the following components:   RBC 6.07 (*)    Hemoglobin 17.9 (*)    All other components within normal limits    EKG None  Radiology CT Angio Head W/Cm &/Or Wo Cm  Result Date: 07/19/2020 CLINICAL DATA:  Pulsatile tinnitus EXAM: CT ANGIOGRAPHY HEAD AND NECK TECHNIQUE: Multidetector CT imaging of the  head and neck was performed using the standard protocol during bolus administration of intravenous contrast. Multiplanar CT image reconstructions and MIPs were obtained to evaluate the vascular anatomy. Carotid stenosis measurements (when applicable) are obtained utilizing NASCET criteria, using the distal internal carotid diameter as the denominator. CONTRAST:  75mL OMNIPAQUE IOHEXOL 350 MG/ML SOLN COMPARISON:  None. FINDINGS: CT HEAD Brain: There is no acute intracranial hemorrhage, mass effect, or edema. Gray-white differentiation is preserved. There is no extra-axial fluid collection. Ventricles and sulci are within normal limits in size and configuration. Vascular: No hyperdense vessel. Skull: Calvarium is unremarkable. Sinuses/Orbits: No acute finding. Other: Density within the external auditory canals probably reflects cerumen. Review of the MIP images confirms the above findings CTA NECK Aortic arch: 3 vessel origins are patent. Right carotid system: Patent. No stenosis or evidence of dissection. Left carotid system: Patent.  No stenosis or evidence of dissection. Vertebral arteries: Patent. Right vertebral artery is dominant. No stenosis or evidence of dissection. Skeleton: No significant abnormality. Other neck: Negative. Upper chest: No apical lung mass. Review of the MIP images confirms the above findings CTA HEAD Anterior circulation: Intracranial internal carotid arteries are patent with calcified plaque but no significant stenosis. Anterior and middle cerebral arteries are patent. Posterior circulation: Intracranial vertebral arteries and basilar artery are patent. Major cerebellar artery origins are patent. Posterior cerebral arteries are patent. Venous sinuses: Patent as allowed by contrast bolus timing. Review of the MIP images confirms the above findings IMPRESSION: No acute intracranial abnormality. No acute or significant vascular abnormality. Presumed cerumen within the external auditory  canals. Electronically Signed   By: Guadlupe SpanishPraneil  Patel M.D.   On: 07/19/2020 14:02   CT Angio Neck W and/or Wo Contrast  Result Date: 07/19/2020 CLINICAL DATA:  Pulsatile tinnitus EXAM: CT ANGIOGRAPHY HEAD AND NECK TECHNIQUE: Multidetector CT imaging of the head and neck was performed using the standard protocol during bolus administration of intravenous contrast. Multiplanar CT image reconstructions and MIPs were obtained to evaluate the vascular anatomy. Carotid stenosis measurements (when applicable)  are obtained utilizing NASCET criteria, using the distal internal carotid diameter as the denominator. CONTRAST:  89mL OMNIPAQUE IOHEXOL 350 MG/ML SOLN COMPARISON:  None. FINDINGS: CT HEAD Brain: There is no acute intracranial hemorrhage, mass effect, or edema. Gray-white differentiation is preserved. There is no extra-axial fluid collection. Ventricles and sulci are within normal limits in size and configuration. Vascular: No hyperdense vessel. Skull: Calvarium is unremarkable. Sinuses/Orbits: No acute finding. Other: Density within the external auditory canals probably reflects cerumen. Review of the MIP images confirms the above findings CTA NECK Aortic arch: 3 vessel origins are patent. Right carotid system: Patent. No stenosis or evidence of dissection. Left carotid system: Patent.  No stenosis or evidence of dissection. Vertebral arteries: Patent. Right vertebral artery is dominant. No stenosis or evidence of dissection. Skeleton: No significant abnormality. Other neck: Negative. Upper chest: No apical lung mass. Review of the MIP images confirms the above findings CTA HEAD Anterior circulation: Intracranial internal carotid arteries are patent with calcified plaque but no significant stenosis. Anterior and middle cerebral arteries are patent. Posterior circulation: Intracranial vertebral arteries and basilar artery are patent. Major cerebellar artery origins are patent. Posterior cerebral arteries are patent.  Venous sinuses: Patent as allowed by contrast bolus timing. Review of the MIP images confirms the above findings IMPRESSION: No acute intracranial abnormality. No acute or significant vascular abnormality. Presumed cerumen within the external auditory canals. Electronically Signed   By: Guadlupe Spanish M.D.   On: 07/19/2020 14:02    Procedures Procedures   Medications Ordered in ED Medications  iohexol (OMNIPAQUE) 350 MG/ML injection 75 mL (75 mLs Intravenous Contrast Given 07/19/20 1335)  potassium chloride SA (KLOR-CON) CR tablet 40 mEq (40 mEq Oral Given 07/19/20 1518)    ED Course  I have reviewed the triage vital signs and the nursing notes.  Pertinent labs & imaging results that were available during my care of the patient were reviewed by me and considered in my medical decision making (see chart for details).    MDM Rules/Calculators/A&P                          Zaven Klemens is a 60 y.o. male with prior past medical history of hypertension, vertigo that presents emerge department today for tinnitus.  Patient with normal neuro exam, however with headache and ongoing tinnitus for over a year we will also obtain CTA head and neck at this time, discussed this with Dr. Dalene Seltzer.  Patient is not currently having any dizziness, and denies headache currently.  Ear exam reveals impacted cerumen worse on right.  Work-up today unremarkable, CTA head and neck negative.  Upon removal of cerumen, patient states that he does feel slightly better, repeat ear exam with normal TMs, no signs of infection.  I think this could be partially patient's problem, however with ongoing headache with tinnitus will refer to ENT.  Patient agreeable with plan, potassium repleted in the ER.  Doubt need for further emergent work up at this time. I explained the diagnosis and have given explicit precautions to return to the ER including for any other new or worsening symptoms. The patient understands and  accepts the medical plan as it's been dictated and I have answered their questions. Discharge instructions concerning home care and prescriptions have been given. The patient is STABLE and is discharged to home in good condition.  I discussed this case with my attending physician who cosigned this note including patient's presenting symptoms, physical  exam, and planned diagnostics and interventions. Attending physician stated agreement with plan or made changes to plan which were implemented.   Final Clinical Impression(s) / ED Diagnoses Final diagnoses:  None    Rx / DC Orders ED Discharge Orders         Ordered    potassium chloride (KLOR-CON) 10 MEQ tablet  Daily        07/19/20 1524           Farrel Gordon, PA-C 07/19/20 1558    Alvira Monday, MD 07/20/20 [phone]

## 2020-07-19 NOTE — Discharge Instructions (Addendum)
Su trabajo de hoy fue tranquilizador. Brett Jones hacer un seguimiento con ENT, su informacin como se indica arriba. Su potasio tambin estaba ligeramente bajo, quiero tomar [employer] [address]ximos [address]. Por favor, haga un seguimiento con su PCP. Su presin arterial tambin estuvo levemente elevada hoy, consulte con su PCP sobre esto tambin. Si tiene algn nuevo empeoramiento de los sntomas, vuelva al departamento de Sports administrator. Asegrate de limpiar bien tus odos, no introduzcas nada en tus tmpanos. Utilice las instrucciones adjuntas.  Your work-up today was reassuring.  I want to follow-up with ENT, their information as above.  Your potassium was also slightly low, I want to take potassium for the next 10 days.  Please follow-up with your PCP.  Your blood pressure was also slightly elevated today, follow-up with your PCP about this as well.  If you have any new worsening concerning symptoms please come back to the emergency department.  Make sure to clean your ears properly, do not stick anything into your eardrums. Use the attached instructions.

## 2020-07-19 NOTE — ED Triage Notes (Signed)
Pt arrives to ED with chief compliant of ringing in his right ear with dizziness that began yesterday morning when he woke up. The ringing his is right ear has been there over the last few months. He denies any headache. No change in vision.

## 2021-06-29 ENCOUNTER — Emergency Department (HOSPITAL_COMMUNITY)
Admission: EM | Admit: 2021-06-29 | Discharge: 2021-06-29 | Disposition: A | Discharge status: Home / Self Care | Payer: 59 | Attending: Emergency Medicine | Admitting: Emergency Medicine

## 2021-06-29 ENCOUNTER — Other Ambulatory Visit: Payer: Self-pay

## 2021-06-29 ENCOUNTER — Emergency Department (HOSPITAL_COMMUNITY): Payer: 59

## 2021-06-29 DIAGNOSIS — I10 Essential (primary) hypertension: Secondary | ICD-10-CM | POA: Diagnosis not present

## 2021-06-29 DIAGNOSIS — Z79899 Other long term (current) drug therapy: Secondary | ICD-10-CM | POA: Insufficient documentation

## 2021-06-29 DIAGNOSIS — M542 Cervicalgia: Secondary | ICD-10-CM | POA: Diagnosis not present

## 2021-06-29 DIAGNOSIS — R519 Headache, unspecified: Secondary | ICD-10-CM | POA: Diagnosis not present

## 2021-06-29 DIAGNOSIS — M25511 Pain in right shoulder: Secondary | ICD-10-CM | POA: Insufficient documentation

## 2021-06-29 DIAGNOSIS — Z9114 Patient's other noncompliance with medication regimen: Secondary | ICD-10-CM | POA: Diagnosis not present

## 2021-06-29 LAB — CBC WITH DIFFERENTIAL/PLATELET
Abs Immature Granulocytes: 0.03 10*3/uL (ref 0.00–0.07)
Basophils Absolute: 0 10*3/uL (ref 0.0–0.1)
Basophils Relative: 0 %
Eosinophils Absolute: 0.4 10*3/uL (ref 0.0–0.5)
Eosinophils Relative: 5 %
HCT: 48.1 % (ref 39.0–52.0)
Hemoglobin: 16.1 g/dL (ref 13.0–17.0)
Immature Granulocytes: 0 %
Lymphocytes Relative: 33 %
Lymphs Abs: 2.6 10*3/uL (ref 0.7–4.0)
MCH: 29.2 pg (ref 26.0–34.0)
MCHC: 33.5 g/dL (ref 30.0–36.0)
MCV: 87.1 fL (ref 80.0–100.0)
Monocytes Absolute: 0.7 10*3/uL (ref 0.1–1.0)
Monocytes Relative: 9 %
Neutro Abs: 4.2 10*3/uL (ref 1.7–7.7)
Neutrophils Relative %: 53 %
Platelets: 269 10*3/uL (ref 150–400)
RBC: 5.52 MIL/uL (ref 4.22–5.81)
RDW: 12.2 % (ref 11.5–15.5)
WBC: 7.9 10*3/uL (ref 4.0–10.5)
nRBC: 0 % (ref 0.0–0.2)

## 2021-06-29 LAB — BASIC METABOLIC PANEL
Anion gap: 7 (ref 5–15)
BUN: 20 mg/dL (ref 6–20)
CO2: 27 mmol/L (ref 22–32)
Calcium: 9.5 mg/dL (ref 8.9–10.3)
Chloride: 103 mmol/L (ref 98–111)
Creatinine, Ser: 1.17 mg/dL (ref 0.61–1.24)
GFR, Estimated: >60 mL/min (ref >60)
Glucose, Bld: 107 mg/dL — ABNORMAL HIGH (ref 70–99)
Potassium: 4.1 mmol/L (ref 3.5–5.1)
Sodium: 137 mmol/L (ref 135–145)

## 2021-06-29 MED ORDER — LISINOPRIL 20 MG PO TABS: 20.0000 mg | ORAL_TABLET | Freq: Every day | ORAL | 0 refills | Status: DC

## 2021-06-29 MED ORDER — AMLODIPINE BESYLATE 10 MG PO TABS: 10.0000 mg | ORAL_TABLET | Freq: Every day | ORAL | 0 refills | Status: DC

## 2021-06-29 NOTE — ED Triage Notes (Signed)
Pt c/o hyptertension, dizziness, neck pain onset yesterday. Hx hypertension, controlled but has been out of meds x4 days. Endorses associated episodes of blurred vision, occipital HA. Denies CP, SHOB, N/V. ?Home reading today 164/108.  ?

## 2021-06-29 NOTE — Discharge Instructions (Signed)
We have sent prescriptions for your blood pressure medicine to your pharmacy.  Call the primary care doctor for follow-up appointment as soon as possible. ?

## 2021-06-29 NOTE — ED Provider Triage Note (Signed)
Emergency Medicine Provider Triage Evaluation Note ? ?Brett Jones , a 61 y.o. male  was evaluated in triage.  Pt complains of hypertension, dizziness, headache.  Patient reports he ran out of his blood pressure medication about 4 days ago, yesterday started having intermittent posterior headache, dizziness and intermittent blurred vision.  He reports no associated chest pain or shortness of breath.  No lower extremity swelling.  No numbness, weakness or tingling.  He reports he has had similar symptoms previously when his blood pressure has been high. ? ?Review of Systems  ?Positive: Headache, dizziness, blurred ?Negative: Chest pain, shortness of breath lower extremity swelling, weakness, numbness ? ?Physical Exam  ?BP (!) 160/102 (BP Location: Right Arm)   Pulse (!) 56   Temp 98.8 ?F (37.1 ?C)   Resp 18   SpO2 98%  ?Gen:   Awake, no distress   ?Resp:  Normal effort, CTA bilat, RRR ?MSK:   Moves extremities without difficulty  ?Other:  No focal neuro deficits ? ?Medical Decision Making  ?Medically screening exam initiated at 5:55 AM.  Appropriate orders placed.  Brett Jones was informed that the remainder of the evaluation will be completed by another provider, this initial triage assessment does not replace that evaluation, and the importance of remaining in the ED until their evaluation is complete. ? ? ?  ?Dartha Lodge, [place] ?06/29/21 4098 ? ?

## 2021-06-29 NOTE — ED Provider Notes (Signed)
[facility] EMERGENCY DEPARTMENT Provider Note   CSN: [id number] Arrival date & time: 06/29/21  0535     History  Chief Complaint  Patient presents with   Hypertension   Headache    Brett Jones is a 61 y.o. male.  HPI Presents for ongoing symptoms since stopping medications, 4 days ago, when he ran out.  He complains of his blood pressure being high, when checked at home.  He also reports dizziness and neck pain that started yesterday.  Additionally he is complaining of right shoulder pain which is ongoing for many years.  He is here with a friend who translates for him at his request.  He denies nausea, vomiting, blurred vision, weakness or paresthesia.    Home Medications Prior to Admission medications   Medication Sig Start Date End Date Taking? Authorizing Provider  amLODipine (NORVASC) 10 MG tablet Take 1 tablet (10 mg total) by mouth daily. 06/29/21  Yes Daleen Bo, MD  lisinopril (ZESTRIL) 20 MG tablet Take 1 tablet (20 mg total) by mouth daily. 06/29/21  Yes Daleen Bo, MD  acetaminophen (TYLENOL) 500 MG tablet Take 1 tablet (500 mg total) by mouth every 6 (six) hours as needed. Patient taking differently: Take 500 mg by mouth every 6 (six) hours as needed for mild pain or moderate pain.  02/12/19   Fawze, Mina A, PA-C  benzonatate (TESSALON) 100 MG capsule Take 1 capsule (100 mg total) by mouth 3 (three) times daily as needed for cough. 02/12/19   Fawze, Mina A, PA-C  chlorthalidone (HYGROTON) 50 MG tablet Take 50 mg by mouth daily.    [provider]  cyclobenzaprine (FLEXERIL) 10 MG tablet Take 1 tablet (10 mg total) by mouth 2 (two) times daily as needed for muscle spasms. Patient not taking: Reported on 09/23/2019 09/19/16   Lysbeth Penner, FNP  meclizine (ANTIVERT) 25 MG tablet Take 1 tablet (25 mg total) by mouth 3 (three) times daily as needed. Patient not taking: Reported on 09/23/2019 06/11/16   Tanna Furry, MD  naproxen (NAPROSYN)  500 MG tablet Take 1 tablet (500 mg total) by mouth 2 (two) times daily with a meal. Patient not taking: Reported on 09/23/2019 09/19/16   Lysbeth Penner, FNP  polyethylene glycol (MIRALAX) 17 g packet Take 17 g by mouth daily as needed for moderate constipation (May take 2 doses a day if not improving.). 09/23/19   Davonna Belling, MD  potassium chloride (KLOR-CON) 10 MEQ tablet Take 2 tablets (20 mEq total) by mouth daily for 10 days. 07/19/20 07/29/20  Alfredia Client, PA-C  predniSONE (DELTASONE) 10 MG tablet Take 10 mg by mouth daily with breakfast.    [provider]      Allergies    Patient has no known allergies.    Review of Systems   Review of Systems  Physical Exam Updated Vital Signs BP 135/80 (BP Location: Right Arm)    Pulse (!) 55    Temp 98.8 F (37.1 C)    Resp 18    SpO2 98%  Physical Exam Vitals and nursing note reviewed.  Constitutional:      General: He is not in acute distress.    Appearance: He is well-developed. He is not ill-appearing or diaphoretic.  HENT:     Head: Normocephalic and atraumatic.     Right Ear: External ear normal.     Left Ear: External ear normal.  Eyes:     Conjunctiva/sclera: Conjunctivae normal.  Pupils: Pupils are equal, round, and reactive to light.  Neck:     Trachea: Phonation normal.  Cardiovascular:     Rate and Rhythm: Normal rate.  Pulmonary:     Effort: Pulmonary effort is normal.  Abdominal:     General: There is no distension.  Musculoskeletal:        General: Normal range of motion.     Cervical back: Normal range of motion and neck supple.     Comments: Somewhat limited motion right shoulder secondary to pain.  No large joint deformities.  Skin:    General: Skin is warm and dry.  Neurological:     Mental Status: He is alert and oriented to person, place, and time.     Cranial Nerves: No cranial nerve deficit.     Sensory: No sensory deficit.     Motor: No abnormal muscle tone.     Coordination:  Coordination normal.     Comments: No dysarthria, aphasia or nystagmus.  Normal strength arms and legs bilaterally.  Psychiatric:        Mood and Affect: Mood normal.        Behavior: Behavior normal.        Thought Content: Thought content normal.        Judgment: Judgment normal.    ED Results / Procedures / Treatments   Labs (all labs ordered are listed, but only abnormal results are displayed) Labs Reviewed  BASIC METABOLIC PANEL - Abnormal; Notable for the following components:      Result Value   Glucose, Bld 107 (*)    All other components within normal limits  CBC WITH DIFFERENTIAL/PLATELET    EKG None  Radiology CT Head Wo Contrast  Result Date: 06/29/2021 CLINICAL DATA:  61 year old male with dizziness, headache, neck pain, hypertension. EXAM: CT HEAD WITHOUT CONTRAST TECHNIQUE: Contiguous axial images were obtained from the base of the skull through the vertex without intravenous contrast. RADIATION DOSE REDUCTION: This exam was performed according to the departmental dose-optimization program which includes automated exposure control, adjustment of the mA and/or kV according to patient size and/or use of iterative reconstruction technique. COMPARISON:  Brain MRI and head CT 12/02/2013. FINDINGS: Brain: Cerebral volume remains normal for age. No midline shift, ventriculomegaly, mass effect, evidence of mass lesion, intracranial hemorrhage or evidence of cortically based acute infarction. Gray-white matter differentiation is stable and within normal limits for age. No convincing encephalomalacia. Vascular: Calcified atherosclerosis at the skull base. No suspicious intracranial vascular hyperdensity. Skull: No acute osseous abnormality identified. Sinuses/Orbits: Visualized paranasal sinuses and mastoids are stable and well aerated. Bilateral tympanic cavities are clear. There is debris in the bilateral external auditory canals. Other: Visualized orbits and scalp soft tissues are  within normal limits. IMPRESSION: Normal for age non contrast CT appearance of the brain, stable since 2015. Electronically Signed   By: Genevie Ann M.D.   On: 06/29/2021 07:22    Procedures Procedures    Medications Ordered in ED Medications - No data to display  ED Course/ Medical Decision Making/ A&P Clinical Course as of 06/29/21 1135  Wed Jun 29, 2021  0934 Waiting for medication reconciliation to determine which medications to prescribe for patient.  He has not had any recent PCP contact, but it appears he is working on scheduling appointment with a new physician. [EW]    Clinical Course User Index [EW] Daleen Bo, MD  Medical Decision Making He is presenting for evaluation of high blood pressure, stating he has run out of his medications 4 days ago.  He states he takes lisinopril 20 mg, and amlodipine 10 mg each day.  He is currently in between providers and is trying to get an appointment to see a new primary care provider.  Problems Addressed: Hypertension, unspecified type: chronic illness or injury    Details: Currently off medicines Noncompliance with medications: acute illness or injury    Details: Stopped medicines 4 days ago.  He does not currently have a PCP  Amount and/or Complexity of Data Reviewed Independent Historian:     Details: Cogent historian using translator at bedside External Data Reviewed: notes.    Details: Plans for outpatient follow-up with Dr. Karle Starch Labs: ordered.    Details: CBC, metabolic panel-normal finding Radiology: ordered and independent interpretation performed.    Details: CT head-no CVA or mass  Risk Prescription drug management. Decision regarding hospitalization. Risk Details: Patient presenting with mild blood pressure elevation, improved spontaneously.  Currently out of amlodipine and lisinopril.  He does not have a provider so I will write him prescriptions for them.  Anticipate outpatient management  by PCP for ongoing care.  No indication for hospitalization or further ED intervention at this time.           Final Clinical Impression(s) / ED Diagnoses Final diagnoses:  Hypertension, unspecified type  Noncompliance with medications    Rx / DC Orders ED Discharge Orders          Ordered    lisinopril (ZESTRIL) 20 MG tablet  Daily        06/29/21 1134    amLODipine (NORVASC) 10 MG tablet  Daily        06/29/21 1134              Daleen Bo, MD 06/29/21 1135

## 2021-07-05 ENCOUNTER — Ambulatory Visit (INDEPENDENT_AMBULATORY_CARE_PROVIDER_SITE_OTHER): Payer: 59

## 2021-07-05 ENCOUNTER — Ambulatory Visit (HOSPITAL_COMMUNITY)
Admission: EM | Admit: 2021-07-05 | Discharge: 2021-07-05 | Disposition: A | Discharge status: Home / Self Care | Payer: 59 | Attending: Nurse Practitioner | Admitting: Nurse Practitioner

## 2021-07-05 ENCOUNTER — Encounter (HOSPITAL_COMMUNITY): Payer: Self-pay | Admitting: Emergency Medicine

## 2021-07-05 ENCOUNTER — Other Ambulatory Visit: Payer: Self-pay

## 2021-07-05 DIAGNOSIS — M25511 Pain in right shoulder: Secondary | ICD-10-CM | POA: Diagnosis not present

## 2021-07-05 DIAGNOSIS — G8929 Other chronic pain: Secondary | ICD-10-CM | POA: Diagnosis not present

## 2021-07-05 DIAGNOSIS — I10 Essential (primary) hypertension: Secondary | ICD-10-CM | POA: Diagnosis not present

## 2021-07-05 MED ORDER — AMLODIPINE BESYLATE 10 MG PO TABS: 10.0000 mg | ORAL_TABLET | Freq: Every day | ORAL | 0 refills | Status: DC

## 2021-07-05 MED ORDER — MELOXICAM 7.5 MG PO TABS: 7.5000 mg | ORAL_TABLET | Freq: Every day | ORAL | 0 refills | Status: DC

## 2021-07-05 MED ORDER — LISINOPRIL 20 MG PO TABS: 20.0000 mg | ORAL_TABLET | Freq: Every day | ORAL | 0 refills | Status: DC

## 2021-07-05 NOTE — Discharge Instructions (Addendum)
Please resume the amlodipine 10 mg daily and lisinopril 20 mg daily for blood pressure.  I have sent a medication to the pharmacy to help with your shoulder pain; the pain may be due to arthritis.  Please follow up with Orthopedic, contact information below, if this does not help.   ?

## 2021-07-05 NOTE — ED Triage Notes (Signed)
Has been checking blood pressure at home.  Having general body pain and feeling fatigue.  Patient reports cough particularly at night ? ?Seen 06/29/2021 in ED.  Reports medicine was not sent to pharmacy ?

## 2021-07-05 NOTE — ED Provider Notes (Addendum)
?[facility] ? ? ? ?CSN: [id number] ?Arrival date & time: 07/05/21  1621 ? ? ?  ? ?History   ?Chief Complaint ?Chief Complaint  ?Patient presents with  ? Hypertension  ? ? ?HPI ?Brett Jones is a 61 y.o. male.  ? ?Patient here with son who is serving as Spanish to [place] interpreter per patient request.  He declines a medical interpreter.  Patient reports running out of blood pressure medication a few days ago and reports BP has been ~ 150s/90s at home.  He denies headache, chest pain, shortness of breath, dizziness/lightheadedness, and swelling in lower extremities.  He does report blurred vision, however admits he may need an updated eyeglasses prescription.  Previously, he was taking lisinopril 20 mg daily and amlodipine 10 mg daily.  ? ?He is also reporting right shoulder pain that has been ongoing for more than 1 year.  He works in Therapist, sports heavy boxes.  He has tried Tylenol without relief in the pain. He reports some tingling in his fingertips and is unable to move the shoulder normally.  He denies bruising, fevers, weakness, decreased grip strength, redness, or swelling of right shoulder joint.  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 ? ? ?Past Medical History:  ?Diagnosis Date  ? Hypertension   ? Vertigo   ? ? ?Patient Active Problem List  ? Diagnosis Date Noted  ? 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 Hypertension   ? ? ?History reviewed. No pertinent surgical history. ? ? ? ? ?Home Medications   ? ?Prior to Admission medications   ?Medication Sig Start Date End Date Taking? Authorizing Provider  ?meloxicam (MOBIC) 7.5 MG tablet Take 1 tablet (7.5 mg total) by mouth daily. 07/05/21  Yes Valentino Nose, NP  ?amLODipine (NORVASC) 10 MG tablet Take 1 tablet (10 mg total) by mouth daily. 07/05/21   Valentino Nose, NP  ?lisinopril (ZESTRIL) 20 MG tablet Take 1 tablet (20 mg total) by mouth daily. 07/05/21   Valentino Nose, NP   ? ? ?Family History ?Family History  ?Problem Relation Age of Onset  ? Hypertension Father   ? Diabetes Father   ? ? ?Social History ?Social History  ? ?Tobacco Use  ? Smoking status: Never  ? Smokeless tobacco: Never  ?Vaping Use  ? Vaping Use: Never used  ?Substance Use Topics  ? Alcohol use: No  ? Drug use: No  ? ? ? ?Allergies   ?Patient has no known allergies. ? ? ?Review of Systems ?Review of Systems ?Per HPI ? ?Physical Exam ?Triage Vital Signs ?ED Triage Vitals  ?Enc Vitals Group  ?   BP 07/05/21 1721 (!) 150/93  ?   Pulse Rate 07/05/21 1721 (!) 56  ?   Resp 07/05/21 1721 18  ?   Temp 07/05/21 1721 98.8 ?F (37.1 ?C)  ?   Temp Source 07/05/21 1721 Oral  ?   SpO2 07/05/21 1721 96 %  ?   Weight --   ?   Height --   ?   Head Circumference --   ?   Peak Flow --   ?   Pain Score 07/05/21 1718 5  ?   Pain Loc --   ?   Pain Edu? --   ?   Excl. in GC? --   ? ?No data found. ? ?Updated Vital Signs ?BP (!) 150/93 (BP Location: Right Arm)   Pulse (!) 56  Temp 98.8 ?F (37.1 ?C) (Oral)   Resp 18   SpO2 96%  ? ?Visual Acuity ?Right Eye Distance:   ?Left Eye Distance:   ?Bilateral Distance:   ? ?Right Eye Near:   ?Left Eye Near:    ?Bilateral Near:    ? ?Physical Exam ?Vitals and nursing note reviewed.  ?Constitutional:   ?   General: He is not in acute distress. ?   Appearance: Normal appearance. He is not toxic-appearing.  ?HENT:  ?   Head: Normocephalic and atraumatic.  ?Eyes:  ?   Extraocular Movements: Extraocular movements intact.  ?   Pupils: Pupils are equal, round, and reactive to light.  ?Cardiovascular:  ?   Rate and Rhythm: Normal rate and regular rhythm.  ?Pulmonary:  ?   Effort: Pulmonary effort is normal. No respiratory distress.  ?   Breath sounds: Normal breath sounds. No wheezing, rhonchi or rales.  ?Musculoskeletal:  ?   Right shoulder: Bony tenderness present. No swelling, deformity or effusion. Decreased range of motion. Normal strength. Normal pulse.  ?   Left shoulder: Normal.  ?   Cervical  back: Normal range of motion.  ?   Right lower leg: No edema.  ?   Left lower leg: No edema.  ?   Comments: Tenderness with passive and active ROM to right shoulder, TTP anterior to right shoulder joint  ?Lymphadenopathy:  ?   Cervical: No cervical adenopathy.  ?Skin: ?   General: Skin is warm and dry.  ?   Coloration: Skin is not jaundiced or pale.  ?   Findings: No erythema.  ?Neurological:  ?   Mental Status: He is alert and oriented to person, place, and time.  ?   Motor: No weakness.  ?   Gait: Gait normal.  ?Psychiatric:     ?   Mood and Affect: Mood normal.     ?   Behavior: Behavior normal.     ?   Thought Content: Thought content normal.     ?   Judgment: Judgment normal.  ? ? ? ?UC Treatments / Results  ?Labs ?(all labs ordered are listed, but only abnormal results are displayed) ?Labs Reviewed - No data to display ? ?EKG ? ? ?Radiology ?DG Shoulder Right ? ?Result Date: 07/05/2021 ?CLINICAL DATA:  Shoulder pain EXAM: RIGHT SHOULDER - 2+ VIEW COMPARISON:  None. FINDINGS: No fracture or malalignment. Mild glenohumeral degenerative change. Mild AC joint degenerative change. IMPRESSION: No acute osseous abnormality Electronically Signed   By: Jasmine Pang M.D.   On: 07/05/2021 18:10   ? ?Procedures ?Procedures (including critical care time) ? ?Medications Ordered in UC ?Medications - No data to display ? ?Initial Impression / Assessment and Plan / UC Course  ?I have reviewed the triage vital signs and the nursing notes. ? ?Pertinent labs & imaging results that were available during my care of the patient were reviewed by me and considered in my medical decision making (see chart for details). ? ?  ?Resume amlodipine 10 mg daily and lisinopril 20 mg daily for blood pressure.  Encouraged ongoing monitoring of BP at home.  Encouraged close follow up with PCP if BP remains elevated, referral placed to assist patient in establishing care with PCP.  ? ?Right shoulder pain likely osteoarthritis.  X-ray today shows  mild arthritis.  Will start Meloxicam for joint pain and encourage follow up with orthopedic with no improvement.   ?Final Clinical Impressions(s) / UC Diagnoses  ? ?Final diagnoses:  ?  Essential hypertension  ?Chronic right shoulder pain  ? ? ? ?Discharge Instructions   ? ?  ?Please resume the amlodipine 10 mg daily and lisinopril 20 mg daily for blood pressure.  I have sent a medication to the pharmacy to help with your shoulder pain; the pain may be due to arthritis.  Please follow up with Orthopedic, contact information below, if this does not help.   ? ? ? ? ?ED Prescriptions   ? ? Medication Sig Dispense Auth. Provider  ? amLODipine (NORVASC) 10 MG tablet Take 1 tablet (10 mg total) by mouth daily. 90 tablet Cathlean Marseilles A, NP  ? lisinopril (ZESTRIL) 20 MG tablet Take 1 tablet (20 mg total) by mouth daily. 90 tablet Cathlean Marseilles A, NP  ? meloxicam (MOBIC) 7.5 MG tablet Take 1 tablet (7.5 mg total) by mouth daily. 30 tablet Valentino Nose, NP  ? ?  ? ?PDMP not reviewed this encounter. ?  ?Valentino Nose, NP ?07/05/21 1817 ? ?  ?Valentino Nose, NP ?07/05/21 1818 ? ?

## 2021-08-15 ENCOUNTER — Encounter (HOSPITAL_COMMUNITY): Payer: Self-pay

## 2021-08-15 ENCOUNTER — Ambulatory Visit: Payer: 59 | Admitting: Internal Medicine

## 2021-08-15 ENCOUNTER — Ambulatory Visit (HOSPITAL_COMMUNITY)
Admission: EM | Admit: 2021-08-15 | Discharge: 2021-08-15 | Disposition: A | Discharge status: Home / Self Care | Payer: No Typology Code available for payment source | Attending: Nurse Practitioner | Admitting: Nurse Practitioner

## 2021-08-15 DIAGNOSIS — I10 Essential (primary) hypertension: Secondary | ICD-10-CM | POA: Diagnosis present

## 2021-08-15 LAB — BASIC METABOLIC PANEL
Anion gap: 4 — ABNORMAL LOW (ref 5–15)
BUN: 13 mg/dL (ref 6–20)
CO2: 28 mmol/L (ref 22–32)
Calcium: 9.7 mg/dL (ref 8.9–10.3)
Chloride: 109 mmol/L (ref 98–111)
Creatinine, Ser: 1.16 mg/dL (ref 0.61–1.24)
GFR, Estimated: >60 mL/min (ref >60)
Glucose, Bld: 102 mg/dL — ABNORMAL HIGH (ref 70–99)
Potassium: 4.5 mmol/L (ref 3.5–5.1)
Sodium: 141 mmol/L (ref 135–145)

## 2021-08-15 MED ORDER — LISINOPRIL 40 MG PO TABS: 40.0000 mg | ORAL_TABLET | Freq: Every day | ORAL | 0 refills | Status: DC

## 2021-08-15 NOTE — ED Triage Notes (Signed)
Pt presents for concerns of HBP. Pt states he regularly checks his BP at home and reports he takes his medications.  ? ?Pt denies leg, feet, arm and hand swelling.  ?

## 2021-08-15 NOTE — ED Provider Notes (Signed)
?[facility] ? ? ? ?CSN: [id number] ?Arrival date & time: 08/15/21  0488 ? ? ?  ? ?History   ?Chief Complaint ?Chief Complaint  ?Patient presents with  ? Hypertension  ? Medication Refill  ? ? ?HPI ?Brett Jones is a 61 y.o. male.  ? ?Patient presents with mother who is serving as Ecologist.  Patient declines medical interpreter services today.  Patient reports since Friday, he has felt his blood pressure is elevated.  He does not recall the numbers that his blood pressure has been at home.  He also endorses headache.  He denies vision changes, blurred vision, chest pain, shortness of breath, dizziness/lightheadedness.  He has been taking the amlodipine 10 mg daily and lisinopril 20 mg daily which he felt like was helping until couple of days ago.  He has not established with primary care provider yet. ? ? ?Past Medical History:  ?Diagnosis Date  ? Hypertension   ? Vertigo   ? ? ?Patient Active Problem List  ? Diagnosis Date Noted  ? 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 Hypertension   ? ? ?History reviewed. No pertinent surgical history. ? ? ? ? ?Home Medications   ? ?Prior to Admission medications   ?Medication Sig Start Date End Date Taking? Authorizing Provider  ?amLODipine (NORVASC) 10 MG tablet Take 1 tablet (10 mg total) by mouth daily. 07/05/21  Yes Valentino Nose, NP  ?lisinopril (ZESTRIL) 40 MG tablet Take 1 tablet (40 mg total) by mouth daily. 08/15/21  Yes Valentino Nose, NP  ?meloxicam (MOBIC) 7.5 MG tablet Take 1 tablet (7.5 mg total) by mouth daily. 07/05/21   Valentino Nose, NP  ? ? ?Family History ?Family History  ?Problem Relation Age of Onset  ? Hypertension Father   ? Diabetes Father   ? ? ?Social History ?Social History  ? ?Tobacco Use  ? Smoking status: Never  ? Smokeless tobacco: Never  ?Vaping Use  ? Vaping Use: Never used  ?Substance Use Topics  ? Alcohol use: No  ? Drug use: No  ? ? ? ?Allergies   ?Patient has no known allergies. ? ? ?Review of  Systems ?Review of Systems ?Per HPI ? ?Physical Exam ?Triage Vital Signs ?ED Triage Vitals  ?Enc Vitals Group  ?   BP 08/15/21 0952 (!) 161/88  ?   Pulse Rate 08/15/21 0952 66  ?   Resp 08/15/21 0952 17  ?   Temp --   ?   Temp src --   ?   SpO2 08/15/21 0952 97 %  ?   Weight --   ?   Height --   ?   Head Circumference --   ?   Peak Flow --   ?   Pain Score 08/15/21 0953 0  ?   Pain Loc --   ?   Pain Edu? --   ?   Excl. in GC? --   ? ?No data found. ? ?Updated Vital Signs ?BP (!) 161/88 (BP Location: Left Arm)   Pulse 66   Resp 17   SpO2 97%  ? ?Visual Acuity ?Right Eye Distance:   ?Left Eye Distance:   ?Bilateral Distance:   ? ?Right Eye Near:   ?Left Eye Near:    ?Bilateral Near:    ? ?Physical Exam ?Vitals and nursing note reviewed.  ?Constitutional:   ?   General: He is not in acute distress. ?   Appearance: Normal appearance. He is not toxic-appearing.  ?  Eyes:  ?   General: No scleral icterus. ?   Extraocular Movements: Extraocular movements intact.  ?   Pupils: Pupils are equal, round, and reactive to light.  ?Cardiovascular:  ?   Rate and Rhythm: Normal rate and regular rhythm.  ?Pulmonary:  ?   Effort: Pulmonary effort is normal. No respiratory distress.  ?   Breath sounds: Normal breath sounds. No wheezing, rhonchi or rales.  ?Musculoskeletal:  ?   Cervical back: Normal range of motion and neck supple. No rigidity.  ?Skin: ?   General: Skin is warm and dry.  ?   Capillary Refill: Capillary refill takes less than 2 seconds.  ?   Coloration: Skin is not jaundiced or pale.  ?   Findings: No erythema.  ?Neurological:  ?   General: No focal deficit present.  ?   Mental Status: He is alert and oriented to person, place, and time.  ?   Cranial Nerves: No cranial nerve deficit.  ?   Sensory: No sensory deficit.  ?   Motor: No weakness.  ?   Coordination: Coordination is intact. Coordination normal.  ?   Gait: Gait normal.  ?Psychiatric:     ?   Behavior: Behavior is cooperative.  ? ? ? ?UC Treatments / Results   ?Labs ?(all labs ordered are listed, but only abnormal results are displayed) ?Labs Reviewed  ?BASIC METABOLIC PANEL  ? ? ?EKG ? ? ?Radiology ?No results found. ? ?Procedures ?Procedures (including critical care time) ? ?Medications Ordered in UC ?Medications - No data to display ? ?Initial Impression / Assessment and Plan / UC Course  ?I have reviewed the triage vital signs and the nursing notes. ? ?Pertinent labs & imaging results that were available during my care of the patient were reviewed by me and considered in my medical decision making (see chart for details). ? ?  ?Blood pressure elevated today in urgent care.  Continue amlodipine 10 mg daily.  Increase lisinopril to 40 mg daily.  We will check kidney function today.  He is neurologically intact on examination today-I suspect headache is related to elevated blood pressure.  Encouraged follow-up with primary care provider for ongoing maintenance of blood pressure. ? Final Clinical Impressions(s) / UC Diagnoses  ? ?Final diagnoses:  ?Essential hypertension  ? ? ? ?Discharge Instructions   ? ?  ?- Please continue the amlodipine 10 mg daily ?-Please increase the lisinopril to 40 mg daily ?-We will let you know if blood work comes back abnormal ?-Please follow-up with primary care for ongoing maintenance of high blood pressure ? ? ? ? ?ED Prescriptions   ? ? Medication Sig Dispense Auth. Provider  ? lisinopril (ZESTRIL) 40 MG tablet Take 1 tablet (40 mg total) by mouth daily. 30 tablet Valentino Nose, NP  ? ?  ? ?PDMP not reviewed this encounter. ?  ?Valentino Nose, NP ?08/15/21 1047 ? ?

## 2021-08-15 NOTE — ED Triage Notes (Signed)
Pt reports when he feels his BP is high he has a headache.  ?

## 2021-08-15 NOTE — ED Triage Notes (Signed)
Pt reports his BP is high at night.  ?

## 2021-08-15 NOTE — Discharge Instructions (Signed)
-   Please continue the amlodipine 10 mg daily ?-Please increase the lisinopril to 40 mg daily ?-We will let you know if blood work comes back abnormal ?-Please follow-up with primary care for ongoing maintenance of high blood pressure ?

## 2021-09-07 ENCOUNTER — Encounter: Payer: Self-pay | Admitting: Internal Medicine

## 2021-09-07 ENCOUNTER — Ambulatory Visit: Payer: 59 | Admitting: Internal Medicine

## 2021-09-07 VITALS — BP 143/81 | HR 49 | Temp 98.2°F | Ht 67.0 in | Wt 195.8 lb

## 2021-09-07 DIAGNOSIS — E669 Obesity, unspecified: Secondary | ICD-10-CM

## 2021-09-07 DIAGNOSIS — Z6830 Body mass index (BMI) 30.0-30.9, adult: Secondary | ICD-10-CM | POA: Diagnosis not present

## 2021-09-07 DIAGNOSIS — I10 Essential (primary) hypertension: Secondary | ICD-10-CM

## 2021-09-07 DIAGNOSIS — M67911 Unspecified disorder of synovium and tendon, right shoulder: Secondary | ICD-10-CM | POA: Diagnosis not present

## 2021-09-07 LAB — POCT GLYCOSYLATED HEMOGLOBIN (HGB A1C): Hemoglobin A1C: 5.2 % (ref 4.0–5.6)

## 2021-09-07 LAB — GLUCOSE, CAPILLARY: Glucose-Capillary: 92 mg/dL (ref 70–99)

## 2021-09-07 MED ORDER — DICLOFENAC SODIUM 1 % EX GEL: 2.0000 g | Freq: Four times a day (QID) | CUTANEOUS | 2 refills | Status: DC

## 2021-09-07 NOTE — Assessment & Plan Note (Signed)
Patient has been getting his HTN adjusted at urgent care. Most recently he was seen in April and they increased his lisinopril to 40, however patient did not get this prescription and was worried about his blood pressure so he began taking his Mom's medicine. He initially tried taking the unknown medication in combination with his lisinopril and his amlodipine however he felt weak with all three so he stopped taking his amlodipine. Currently (including today) he has taken his lisinopril 20 in addition to the unknown medication. Discussed with patient that it could be dangerous to combine medications and to stop taking his mother's medication. It would be helpful if we knew what medication he had been taking though and he was asked to see if he could find the bottle and call into clinic to tell us the name of the medication. Will defer making any medication adjustments until he is able to come back into clinic. ?- continue amlodipine 10 and lisinopril 20 for now (never picked up lisinopril 40) ?-clarify unknown medication ?- adjust medications at f/u visit.  ?

## 2021-09-07 NOTE — Progress Notes (Addendum)
? ?  CC: blood pressure ? ?HPI: ? ?[name] is a 61 y.o. PMH noted below, who presents to the [facility] with complaints of high blood pressure. To see the management of his acute and chronic conditions, please refer to the A&P note under the encounters tab.  ? ?Past Medical History:  ?Diagnosis Date  ? Hypertension   ? Vertigo   ? ?Family History  ?Problem Relation Age of Onset  ? Hypertension Mother   ? Osteoporosis Mother   ? Hypertension Father   ? Diabetes Father   ? Heart disease Father   ? Hypertension Son   ? ?Social History: ?Patient works in Camera operator, lives with his son, enjoys playing basketball, not sexually active, no substance use ? ?Review of Systems:Positive for shoulder pain, negative for shortness of breath, lightheadedness, dizziness, chest pain ? ?Physical Exam: ?Gen: middle aged man in NAD ?HEENT: normocephalic atraumatic, MMM ?CV: RRR, no m/r/g   ?Resp: CTAB, normal WOB  ?GI: soft, nontender ?MSK: positive empty can test, cross body horizontal adduction of the right arm limited 2/2 pain, full strength throughout ?Skin:warm and dry, no lower extremity edema ?Neuro:alert answering questions appropriately ?Psych: normal affect ? ? ?Assessment & Plan:  ? ?See Encounters Tab for problem based charting. ? ?Patient discussed with Dr. Sol Blazing  ? ?

## 2021-09-07 NOTE — Patient Instructions (Addendum)
?  scar Rodr?guez ? ?Fue un placer verte hoy en la cl?nica. ? ?Hablamos sobre [employer], su presi?n arterial y su dolor de hombro. ? ?Presi?n arterial: llame a nuestra cl?nica para informarnos cu?l es el nombre del medicamento para la presi?n arterial que est? tomando. O puede traer la botella/tomarle una foto y llevarla a su pr?xima cita. Seguir tomando el amlodipino 10 y el lisinopril 20 ? ?Dolor de hombro: lo voy a derivar a fisioterapia y a los m?dicos de medicina deportiva. Para el dolor de hombro, puede intentar poner gel de voltaren en el hombro para ayudar con la inflamaci?n. ? ?Llame a nuestra cl?nica al [phone] si tiene alguna pregunta o inquietud. El mejor horario para llamar es de lunes a viernes de 9 a. m. a 4 p. m., pero hay alguien disponible las 24 horas del d?a, los 7 d?as de la semana en el mismo n?mero. Si necesita reposici?n de medicamentos, por favor notifique a su farmacia con una semana de anticipaci?n y ellos nos enviar?n una solicitud. ? ?Gracias por dejarnos participar en su cuidado. ?Esperamos verte la pr?xima vez! ? ? ?Clide Dales ? ?It was a pleasure seeing you in the clinic today.  ? ?We talked about your overall health, your blood pressure, and your shoulder pain. ? ?Blood pressure- Please call our clinic to let us know what the name of the blood pressure medication you are taking. Or you can bring the bottle/take a picture of it and bring it to your next appointment. Continue taking the amlodipine 10 and the lisinopril 20 ? ?Shoulder pain- I am going to refer you to physical therapy and the sports medicine doctors. For the shoulder pain you can try putting voltaren gel on your shoulder to help with the inflammation. ? ?Please call our clinic at [phone] if you have any questions or concerns. The best time to call is Monday-Friday from 9am-4pm, but there is someone available 24/7 at the same number. If you need medication refills, please notify your pharmacy one  week in advance and they will send [place] a request. ?  ?Thank you for letting us take part in your care. We look forward to seeing you next time! ? ?

## 2021-09-07 NOTE — Assessment & Plan Note (Signed)
Patient has pain in his right shoulder that is limiting his ability to complete his job. He has difficulty lifting his arms over his head or adducting his right arm. This has been going on gradually and getting worse. Denies trauma to the arm and pain did not suddenly start. He also gets pain at night when laying on that shoulder. He has full strength and range of motion on exam but does have limited across body adduction 2/2 pain and pain on the empty can test.  ?- sports medicine referral ?- PT referral ?- voltaren gel ?- work note provided ?

## 2021-09-12 ENCOUNTER — Other Ambulatory Visit: Payer: Self-pay | Admitting: *Deleted

## 2021-09-12 MED ORDER — LISINOPRIL 20 MG PO TABS: 20.0000 mg | ORAL_TABLET | Freq: Every day | ORAL | 3 refills | Status: DC

## 2021-09-12 MED ORDER — AMLODIPINE BESYLATE 10 MG PO TABS: 10.0000 mg | ORAL_TABLET | Freq: Every day | ORAL | 3 refills | Status: DC

## 2021-09-12 NOTE — Telephone Encounter (Signed)
Patient walked in with mother stating he has been taking her metoprolol 50 mg daily. States he has been out of amlodipine and lisinopril for 2 months. Requests refill on both these meds at [facility] on [facility]. Per OV note on 5/10 patient did not p/u Rx for lisinopril 40 mg and was instructed to take lisinopril 20 mg. He has been advised to stop taking metoprolol, and only take amlodipine and lisinopril. Confirmed his next appt for 6/7 at 3:15. ?

## 2021-09-12 NOTE — Progress Notes (Signed)
Internal Medicine Clinic Attending ? ?Case discussed with Dr. Darrick Huntsman  At the time of the visit.  We reviewed the resident?s history and exam and pertinent patient test results.  I agree with the assessment, diagnosis, and plan of care documented in the resident?s note. Discussed oral NSAIDs prn for severe shoulder pain, although would use cautiously and sparingly in the setting of uncontrolled hypertension and ACEi use. ?

## 2021-10-05 ENCOUNTER — Encounter: Payer: Self-pay | Admitting: Student

## 2021-10-05 ENCOUNTER — Encounter: Payer: 59 | Admitting: Student

## 2021-10-18 ENCOUNTER — Encounter: Payer: Self-pay | Admitting: Student

## 2021-10-18 ENCOUNTER — Ambulatory Visit: Payer: 59 | Admitting: Student

## 2021-10-18 ENCOUNTER — Other Ambulatory Visit: Payer: Self-pay

## 2021-10-18 VITALS — BP 143/78 | HR 66 | Temp 98.2°F | Ht 67.0 in | Wt 202.7 lb

## 2021-10-18 DIAGNOSIS — I10 Essential (primary) hypertension: Secondary | ICD-10-CM | POA: Diagnosis not present

## 2021-10-18 DIAGNOSIS — M67911 Unspecified disorder of synovium and tendon, right shoulder: Secondary | ICD-10-CM | POA: Diagnosis not present

## 2021-10-18 MED ORDER — LISINOPRIL 40 MG PO TABS: 40.0000 mg | ORAL_TABLET | Freq: Every day | ORAL | 3 refills | Status: DC

## 2021-10-18 NOTE — Assessment & Plan Note (Signed)
Today's Vitals   10/18/21 1011  BP: (!) 143/78  Pulse: 66  Temp: 98.2 F (36.8 C)  TempSrc: Oral  SpO2: 98%  Weight: 202 lb 11.2 oz (91.9 kg)  Height: 5\' 7"  (1.702 m)   Body mass index is 31.75 kg/m.  Patient living with HTN, currently on norvasc 10mg  daily and lisinopril 20mg  daily. BP mildly elevated above goal of <140/90 for primary prevention. Will increase lisinopril to 40mg  daily. He had normal kidney function on BMP after initiation of lisinopril 20mg  daily so will check again at next visit after dose increase.  Plan: -continue norvasc 10mg  daily -increase lisinopril to 40mg  daily -BMP at next visit for BP recheck in 1 month

## 2021-10-18 NOTE — Patient Instructions (Addendum)
Sr. Brett Jones,  Fue un placer verte hoy en la clnica.  1. He aumentado su lisinopril a 40 mg diarios. Recoja la nueva receta cuando se le acabe la actual. 2. He colocado una referencia de medicina deportiva para su hombro. Ellos le darn una llamada para programar esta cita. 3. Vuelva en 1 mes para volver a controlar su presin arterial.  Llame a nuestra clnica al [phone] si tiene alguna pregunta o inquietud. El mejor horario para llamar es de lunes a viernes de 9 a. m. a 4 p. m., pero hay alguien disponible las 24 horas del da, los 7 das de la semana en el mismo nmero. Si necesita reposicin de medicamentos, por favor notifique a su farmacia con una semana de anticipacin y ellos nos enviarn una solicitud.  Gracias por dejarnos participar en su cuidado. Esperamos verte la prxima vez!   Brett Jones,  It was a pleasure seeing you in the clinic today.   I have increased your lisinopril to 40mg  daily. Please pick up the new prescription when you run out of your current one. I have placed a sports medicine referral for your shoulder. They will give you a call to schedule this appointment. Please come back in 1 month for recheck of your blood pressure.  Please call our clinic at [phone] if you have any questions or concerns. The best time to call is Monday-Friday from 9am-4pm, but there is someone available 24/7 at the same number. If you need medication refills, please notify your pharmacy one week in advance and they will send [phone] a request.   Thank you for letting us take part in your care. We look forward to seeing you next time!

## 2021-10-18 NOTE — Progress Notes (Signed)
   CC: f/u HTN  HPI:  [name] is a 61 y.o. male with history listed below presenting to the [facility] for f/u HTN. Please see individualized problem based charting for full HPI.  Past Medical History:  Diagnosis Date   Hypertension    Vertigo     Review of Systems:  Negative aside from that listed in individualized problem based charting.  Physical Exam:  Vitals:   10/18/21 1011  BP: (!) 143/78  Pulse: 66  Temp: 98.2 F (36.8 C)  TempSrc: Oral  SpO2: 98%  Weight: 202 lb 11.2 oz (91.9 kg)  Height: 5\' 7"  (1.702 m)   Physical Exam Constitutional:      Appearance: He is obese. He is not ill-appearing.  HENT:     Mouth/Throat:     Mouth: Mucous membranes are moist.     Pharynx: Oropharynx is clear.  Eyes:     Extraocular Movements: Extraocular movements intact.     Conjunctiva/sclera: Conjunctivae normal.     Pupils: Pupils are equal, round, and reactive to light.  Cardiovascular:     Rate and Rhythm: Normal rate and regular rhythm.     Pulses: Normal pulses.     Heart sounds: Normal heart sounds. No murmur heard.    No gallop.  Pulmonary:     Effort: Pulmonary effort is normal.     Breath sounds: Normal breath sounds. No wheezing, rhonchi or rales.  Abdominal:     General: Bowel sounds are normal. There is no distension.     Palpations: Abdomen is soft.     Tenderness: There is no abdominal tenderness.  Musculoskeletal:        General: No swelling.     Comments: Tenderness with cross body adduction of right shoulder. Otherwise, ROM is intact.  Skin:    General: Skin is warm and dry.  Neurological:     Mental Status: He is alert and oriented to person, place, and time. Mental status is at baseline.  Psychiatric:        Mood and Affect: Mood normal.        Behavior: Behavior normal.      Assessment & Plan:   See Encounters Tab for problem based charting.  Patient discussed with Dr.  

## 2021-10-18 NOTE — Assessment & Plan Note (Signed)
Patient has not been finding much benefit with physical therapy and does not want to continue. He has not yet been evaluated by Sports Medicine so will place a referral today. Please see Dr. Warner Mccreedy note from 09/07/2021 for full details. My exam today is essentially unchanged from that time. Voltaren gel is provided some benefit so will continue this for now.  Plan: -continue voltaren gel -sports medicine referral

## 2021-10-28 ENCOUNTER — Other Ambulatory Visit: Payer: Self-pay | Admitting: *Deleted

## 2021-10-28 ENCOUNTER — Telehealth: Payer: Self-pay | Admitting: Student

## 2021-10-28 NOTE — Telephone Encounter (Signed)
Call from pt's son who speaks english - requesting refill on Amlodipine. According to chart, there are refills. He stated he had called the pharmacy.  I talked to the pharmacist at [facility] about pt's BP meds. Informed Pt is on Lisinopril 40 mg per his med list; they have pt on 10 mg. Informed the pharmacist  of the dates in which each med was refilled. She stated after searching,they have 2 different profiles for this pt ; one with a different last name. Stated she will talk to the pt when he picks up his medications.  I called the son back - no answer; left message pt can pick up his meds later today.

## 2021-10-28 NOTE — Telephone Encounter (Signed)
MED REFILL REQUEST  amLODipine (NORVASC) 10 MG tablet   [facility] [address] [place], [place] - [address] Phone:  [phone]  Fax:  [phone]

## 2021-10-28 NOTE — Telephone Encounter (Signed)
Pt has refill. See my today's telephone encounter

## 2021-11-17 ENCOUNTER — Other Ambulatory Visit: Payer: Self-pay

## 2021-11-17 ENCOUNTER — Encounter: Payer: Self-pay | Admitting: Student

## 2021-11-17 ENCOUNTER — Ambulatory Visit: Payer: 59 | Admitting: Student

## 2021-11-17 VITALS — BP 152/86 | HR 63 | Temp 98.0°F | Ht 67.0 in | Wt 200.4 lb

## 2021-11-17 DIAGNOSIS — M67911 Unspecified disorder of synovium and tendon, right shoulder: Secondary | ICD-10-CM

## 2021-11-17 DIAGNOSIS — I10 Essential (primary) hypertension: Secondary | ICD-10-CM | POA: Diagnosis not present

## 2021-11-17 MED ORDER — LISINOPRIL-HYDROCHLOROTHIAZIDE 20-12.5 MG PO TABS: 2.0000 | ORAL_TABLET | Freq: Every day | ORAL | 11 refills | Status: DC

## 2021-11-17 MED ORDER — NAPROXEN 500 MG PO TABS: 500.0000 mg | ORAL_TABLET | Freq: Two times a day (BID) | ORAL | 0 refills | Status: AC

## 2021-11-17 NOTE — Assessment & Plan Note (Addendum)
Patient reports worsening right shoulder pain for the past month. Aching pain along the posterior shoulder that is worse at night. Has tried Voltaren gel with no relief. Did not try Tylenol or ibuprofen. He reports completing physical therapy which did not help the pain. He has a referral to sports medicine. Today his pain is 10/10. Discussed with patient about taking short course naproxen and stop the Voltaren gel.  On exam, right shoulder was not TTP, posterior shoulder pain with ROM, positive empty can test and painful arc. Right elbow was normal.   Plan -Appointment scheduled with sports med on 7/26  -Naproxen 500 mg twice daily for 5 days

## 2021-11-17 NOTE — Assessment & Plan Note (Signed)
Vitals:   11/17/21 1500  BP: (!) 152/86  Pulse: 63  Temp: 98 F (36.7 C)  SpO2: 97%   Presents today with elevated BP.  He is taking amlodipine 10 mg daily and lisinopril 40 mg daily.  Tolerating both meds well. Denies chest pain, SOB, headache, lightheadedness, dizziness or n/v. He checks his BP at home. The elevated BP could be related to the right shoulder pain. Will need to re-evaluate at next visit.  Plan -Continue amlodipine -Switch to lisinopril-HCTZ 40-25mg  daily -BMP today -f/u in 1-2 weeks for recheck

## 2021-11-17 NOTE — Progress Notes (Signed)
   CC: HTN follow-up and right shoulder pain  HPI:  [name] is a 61 y.o. male living with a history stated below and presents today for follow-up of hypertension and worsening right shoulder pain. Please see problem based assessment and plan for additional details.  Past Medical History:  Diagnosis Date   Hypertension    Vertigo     Current Outpatient Medications on File Prior to Visit  Medication Sig Dispense Refill   amLODipine (NORVASC) 10 MG tablet Take 1 tablet (10 mg total) by mouth daily. 90 tablet 3   No current facility-administered medications on file prior to visit.   Review of Systems: ROS negative except for what is noted on the assessment and plan.  Vitals:   11/17/21 1500  BP: (!) 152/86  Pulse: 63  Temp: 98 F (36.7 C)  TempSrc: Oral  SpO2: 97%  Weight: 200 lb 6.4 oz (90.9 kg)  Height: 5\' 7"  (1.702 m)   Physical Exam: Constitutional: well-appearing, in no acute distress HENT: normocephalic atraumatic Neck: supple Cardiovascular: regular rate and rhythm Pulmonary/Chest: normal work of breathing on room air, lungs clear to auscultation bilaterally MSK: R shoulder: no erythema, not TTP, pain is located along posterior shoulder, pain with abduction, extension, flexion, and arm rotation. R elbow: normal ROM Neurological: alert & oriented x 3 Skin: warm and dry Psych: normal mood and behavior   Assessment & Plan:   Hypertension Vitals:   11/17/21 1500  BP: (!) 152/86  Pulse: 63  Temp: 98 F (36.7 C)  SpO2: 97%   Presents today with elevated BP.  He is taking amlodipine 10 mg daily and lisinopril 40 mg daily.  Tolerating both meds well. Denies chest pain, SOB, headache, lightheadedness, dizziness or n/v. He checks his BP at home. The elevated BP could be related to the right shoulder pain. Will need to re-evaluate at next visit.  Plan -Continue amlodipine -Switch to lisinopril-HCTZ 40-25mg  daily -BMP today -f/u in 1-2 weeks for  recheck  Tendinopathy of right rotator cuff Patient reports worsening right shoulder pain for the past month. Aching pain along the posterior shoulder that is worse at night. Has tried Voltaren gel with no relief. Did not try Tylenol or ibuprofen. He reports completing physical therapy which did not help the pain. He has a referral to sports medicine. Today his pain is 10/10. Discussed with patient about taking short course naproxen and stop the Voltaren gel.  On exam, right shoulder was not TTP, posterior shoulder pain with ROM, positive empty can test and painful arc. Right elbow was normal.   Plan -Appointment scheduled with sports med on 7/26  -Naproxen 500 mg twice daily for 5 days   Patient seen with Dr. 8/26, D.O. [facility] Internal [facility], PGY-1 Phone: [phone] Date 11/17/2021 Time 4:49 PM

## 2021-11-17 NOTE — Patient Instructions (Addendum)
Thank you, Mr.Muhsin Ricketts for allowing [place] to provide your care today. Today we discussed blood pressure and right shoulder pain.    Blood pressure -Continue amlodipine 10 mg daily -Start lisinopril-HCTZ  40mg -25mg  (so 2 tablets of 20mg -12.5mg ) daily -continue to check your blood pressure at home -if you feel dizzy or lightheaded, please contact the clinic so that we can adjust your dose -blood work today, I will call your son with the results  Right Shoulder pain -referral to Sports Medicine -Naproxen 500 mg twice a day with meals for 5 days -stop the Voltaren gel  I have ordered the following labs for you:  Lab Orders         BMP8+Anion Gap       Referrals ordered today:   Referral Orders  No referral(s) requested today     I have ordered the following medication/changed the following medications:   Stop the following medications: Medications Discontinued During This Encounter  Medication Reason   lisinopril (ZESTRIL) 40 MG tablet    diclofenac Sodium (VOLTAREN) 1 % GEL      Start the following medications: Meds ordered this encounter  Medications   naproxen (NAPROSYN) 500 MG tablet    Sig: Take 1 tablet (500 mg total) by mouth 2 (two) times daily with a meal for 5 days.    Dispense:  10 tablet    Refill:  0   lisinopril-hydrochlorothiazide (ZESTORETIC) 20-12.5 MG tablet    Sig: Take 2 tablets by mouth daily.    Dispense:  60 tablet    Refill:  11     Follow up:  1-2 weeks     Should you have any questions or concerns please call the internal medicine clinic at [phone].    , D.O. [facility]

## 2021-11-18 LAB — BMP8+ANION GAP
Anion Gap: 15 mmol/L (ref 10.0–18.0)
BUN/Creatinine Ratio: 14 (ref 10–24)
BUN: 15 mg/dL (ref 8–27)
CO2: 22 mmol/L (ref 20–29)
Calcium: 10.3 mg/dL — ABNORMAL HIGH (ref 8.6–10.2)
Chloride: 101 mmol/L (ref 96–106)
Creatinine, Ser: 1.1 mg/dL (ref 0.76–1.27)
Glucose: 86 mg/dL (ref 70–99)
Potassium: 4.4 mmol/L (ref 3.5–5.2)
Sodium: 138 mmol/L (ref 134–144)
eGFR: 77 mL/min/{1.73_m2} (ref >59)

## 2021-11-21 NOTE — Progress Notes (Signed)
Internal Medicine Clinic Attending  I saw and evaluated the patient.  I personally confirmed the key portions of the history and exam documented by Dr. Zheng and I reviewed pertinent patient test results.  The assessment, diagnosis, and plan were formulated together and I agree with the documentation in the resident's note.  

## 2021-12-06 ENCOUNTER — Ambulatory Visit (INDEPENDENT_AMBULATORY_CARE_PROVIDER_SITE_OTHER): Payer: No Typology Code available for payment source | Admitting: Internal Medicine

## 2021-12-06 VITALS — BP 132/82 | HR 72 | Wt 200.4 lb

## 2021-12-06 DIAGNOSIS — I10 Essential (primary) hypertension: Secondary | ICD-10-CM | POA: Diagnosis not present

## 2021-12-06 DIAGNOSIS — Z0000 Encounter for general adult medical examination without abnormal findings: Secondary | ICD-10-CM | POA: Insufficient documentation

## 2021-12-06 DIAGNOSIS — Z1211 Encounter for screening for malignant neoplasm of colon: Secondary | ICD-10-CM

## 2021-12-06 MED ORDER — AMLODIPINE BESYLATE 10 MG PO TABS
10.0000 mg | ORAL_TABLET | Freq: Every day | ORAL | 3 refills | Status: AC
Start: 2021-12-06 — End: ?

## 2021-12-06 NOTE — Assessment & Plan Note (Signed)
Referral for screening colonoscopy

## 2021-12-06 NOTE — Progress Notes (Signed)
   CC: 2 week BP follow up  HPI:  [name] is a 61 y.o. male living with a history stated below and presents today for a 2 week BP follow up. The encounter was completed with the assistance of a translator Please see problem based assessment and plan for additional details.  Past Medical History:  Diagnosis Date   Hypertension    Vertigo     Current Outpatient Medications on File Prior to Visit  Medication Sig Dispense Refill   lisinopril-hydrochlorothiazide (ZESTORETIC) 20-12.5 MG tablet Take 2 tablets by mouth daily. 60 tablet 11   No current facility-administered medications on file prior to visit.    Family History  Problem Relation Age of Onset   Hypertension Mother    Osteoporosis Mother    Hypertension Father    Diabetes Father    Heart disease Father    Hypertension Son     Social History   Socioeconomic History   Marital status: Single    Spouse name: Not on file   Number of children: Not on file   Years of education: Not on file   Highest education level: Not on file  Occupational History   Not on file  Tobacco Use   Smoking status: Never   Smokeless tobacco: Never  Vaping Use   Vaping Use: Never used  Substance and Sexual Activity   Alcohol use: No   Drug use: No   Sexual activity: Not Currently  Other Topics Concern   Not on file  Social History Narrative   Not on file   Social Determinants of Health   Financial Resource Strain: Not on file  Food Insecurity: Not on file  Transportation Needs: Not on file  Physical Activity: Not on file  Stress: Not on file  Social Connections: Not on file  Intimate Partner Violence: Not on file    Review of Systems: ROS negative except for what is noted on the assessment and plan.  Vitals:   12/06/21 1601  BP: 132/82  Pulse: 72  SpO2: 97%  Weight: 200 lb 6.4 oz (90.9 kg)    Physical Exam: Constitutional: well-appearing male sitting in chair, in no acute distress Cardiovascular:  regular rate and rhythm, no m/r/g Pulmonary/Chest: normal work of breathing on room air, lungs clear to auscultation bilaterally MSK: normal bulk and tone Neurological: alert & oriented x 3, no focal deficit Skin: warm and dry Psych: normal mood and behavior  Assessment & Plan:     Patient discussed with Dr. Cleda Daub  Hypertension BP Readings from Last 3 Encounters:  12/06/21 132/82  11/17/21 (!) 152/86  10/18/21 (!) 143/78   The patient was seen 2 weeks ago and was found to have an elevated BP to 152/86.  He was taking amlodipine 10 and lisinopril 40 mg daily, and thus we will switch to lisinopril-HCTZ 40-25 mg daily.  He was also continued on his amlodipine 10 mg daily. BMP was checked at that time and Cr was within normal limits.  BP is well controlled today.  Plan: - Continue amlodipine 10 mg daily and lisinopril-HCTZ 40-25 mg daily  Healthcare maintenance Referral for screening colonoscopy   Neyah Ellerman, D.O. [facility] Internal [facility], PGY-2 Phone: [phone] Date 12/06/2021 Time 4:30 PM

## 2021-12-06 NOTE — Patient Instructions (Signed)
Karl Pock, Mr.Marquee Sherlon Handing por permitirnos brindarle atencin hoy. hoy discutimos:   Siga tomando "amlodipine" una vez al dia y siga tomando 2 tabletas de lisinopril-HCTZ todos los dias para su presion arterial. Tu presion arterial es excelente hoy!  He enviado una referencia al gastroenterologo para una colonoscopia para detectar cancer de colon  He ordenado los siguientes laboratorios para usted:  Lab Orders  No laboratory test(s) ordered today     Pruebas ordenadas hoy:  Referencias ordenadas hoy:   Referral Orders         Ambulatory referral to Gastroenterology      He ordenado el siguiente medicamento/cambiado los siguientes medicamentos:  Suspender los siguientes medicamentos: Medications Discontinued During This Encounter  Medication Reason   amLODipine (NORVASC) 10 MG tablet Reorder     Iniciar los siguientes medicamentos: Meds ordered this encounter  Medications   amLODipine (NORVASC) 10 MG tablet    Sig: Take 1 tablet (10 mg total) by mouth daily.    Dispense:  90 tablet    Refill:  3     Seguimiento 6 months     Si tiene [place] pregunta o inquietud, llame a la clnica de medicina interna al [phone].     Elza Rafter, D.O. [facility]

## 2021-12-06 NOTE — Assessment & Plan Note (Addendum)
BP Readings from Last 3 Encounters:  12/06/21 132/82  11/17/21 (!) 152/86  10/18/21 (!) 143/78   The patient was seen 2 weeks ago and was found to have an elevated BP to 152/86.  He was taking amlodipine 10 and lisinopril 40 mg daily, and thus we will switch to lisinopril-HCTZ 40-25 mg daily.  He was also continued on his amlodipine 10 mg daily. BMP was checked at that time and Cr was within normal limits.  BP is well controlled today.  Plan: - Continue amlodipine 10 mg daily and lisinopril-HCTZ 40-25 mg daily

## 2021-12-09 NOTE — Progress Notes (Signed)
Internal Medicine Clinic Attending  Case discussed with the resident at the time of the visit.  We reviewed the resident's history and exam and pertinent patient test results.  I agree with the assessment, diagnosis, and plan of care documented in the resident's note.  

## 2022-08-02 IMAGING — CT CT HEAD W/O CM
4 series · 16 of 47 positions shown, 18 images · non-contrast
Comparison: Brain MRI and head CT 12/02/2013.

CLINICAL DATA: 60-year-old male with dizziness, headache, neck
pain, hypertension.



[Series 3: head without · axial · non-contrast · 0.43mm/px · z∈[-106,+19]mm · 7 of 35 slices shown, 9 images]
[im 5/35  brain]
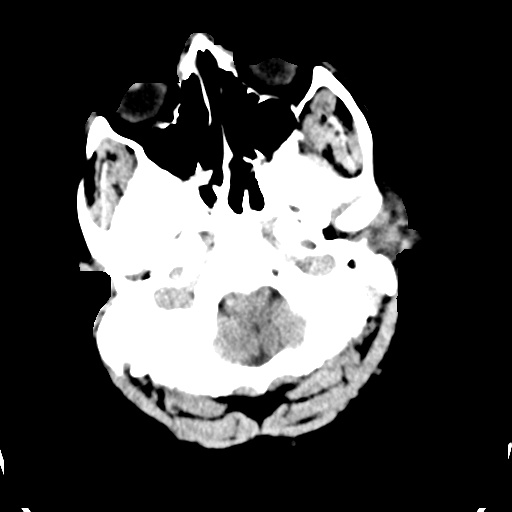
[im 5/35  bone]
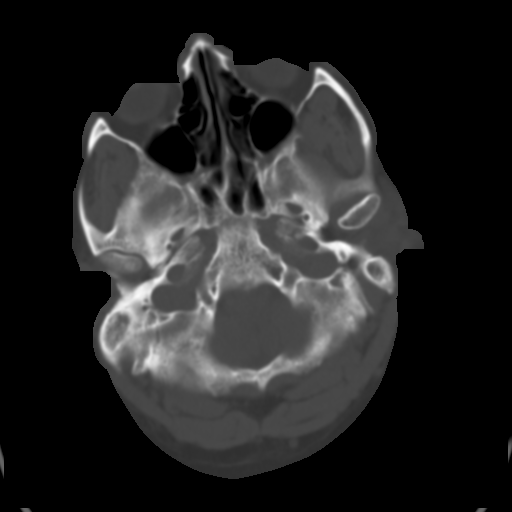
[im 9/35  brain]
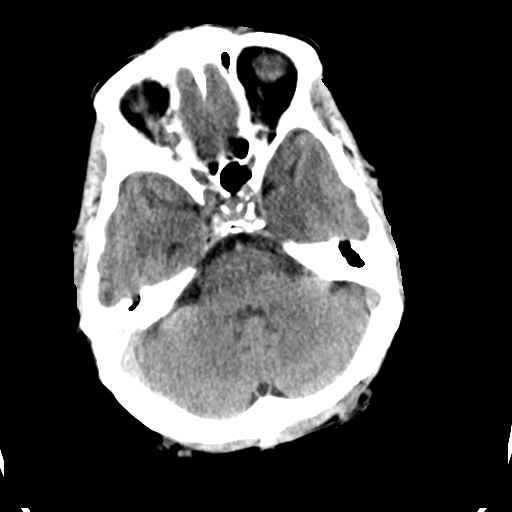
[im 13/35  brain]
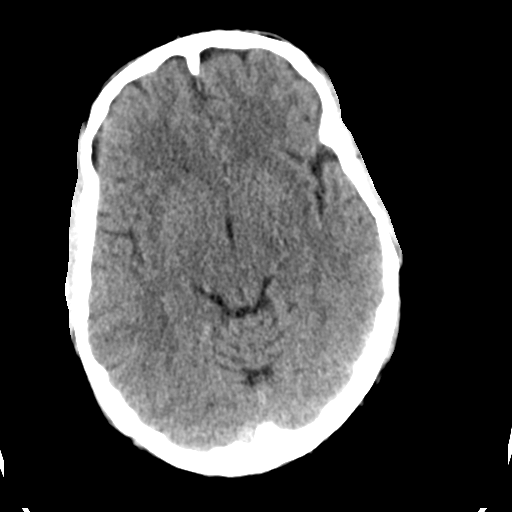
[im 18/35  brain]
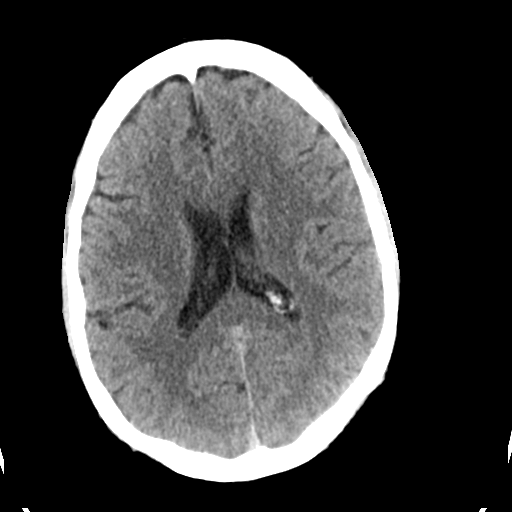
[im 22/35  brain]
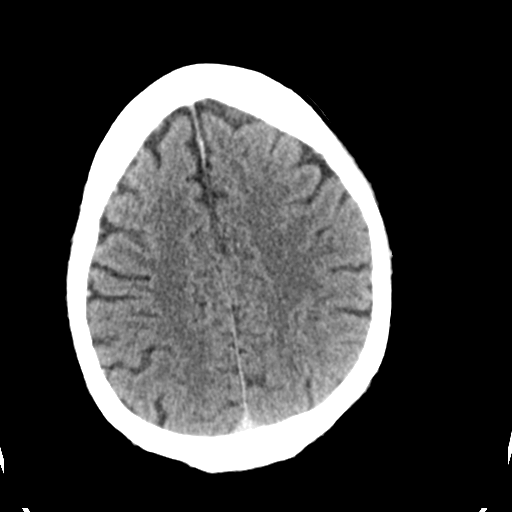
[im 22/35  bone]
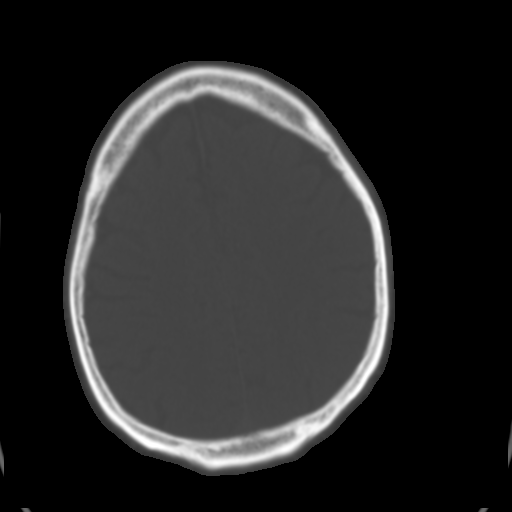
[im 26/35  brain]
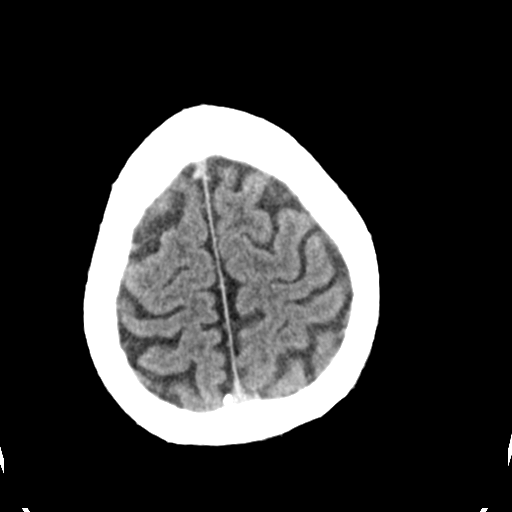
[im 30/35  brain]
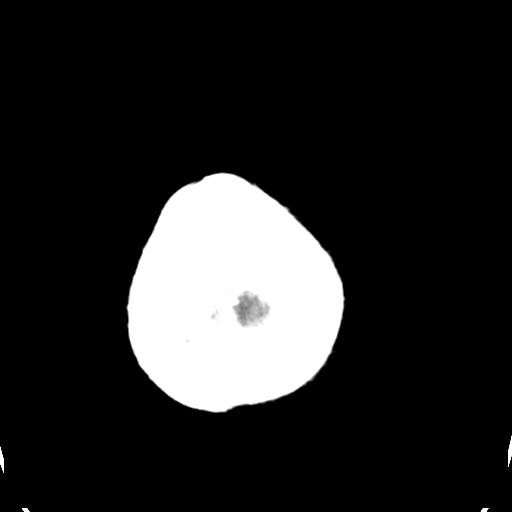

[Series 4: head bone · axial · 0.43mm/px · z∈[-110,-76]mm · 3 of 86 slices shown]
[im 9/86  bone]
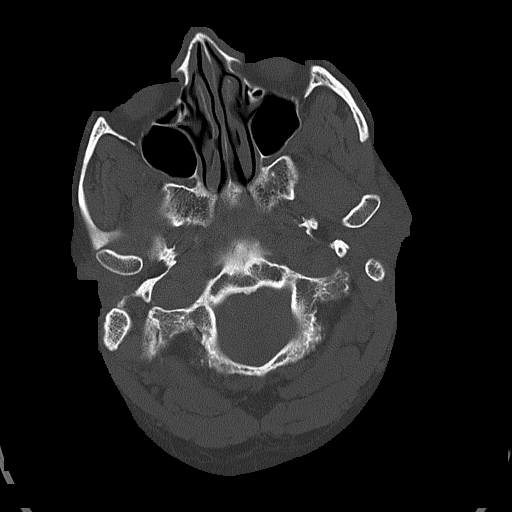
[im 18/86  bone]
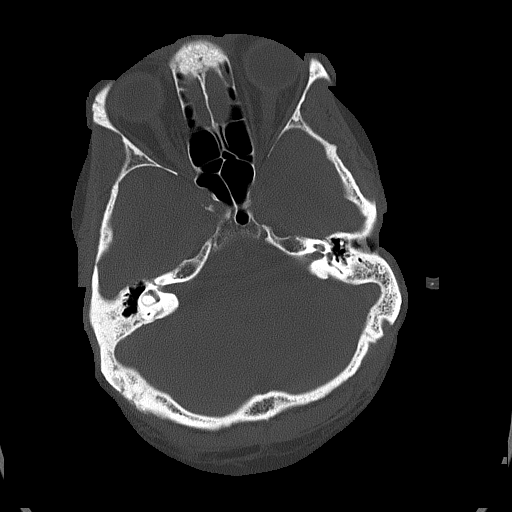
[im 26/86  bone]
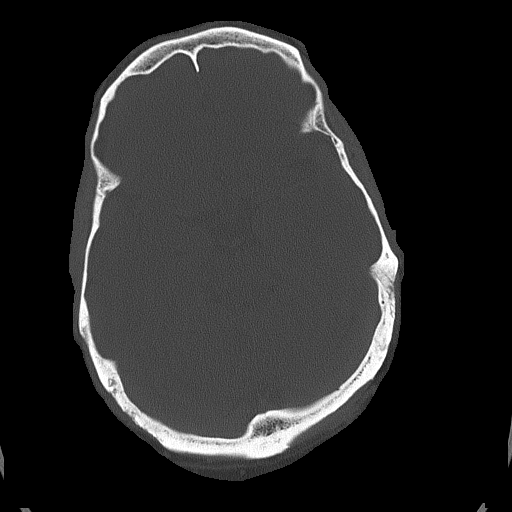

[Series 5: head without cor · coronal · non-contrast · 0.33mm/px · 3 of 74 slices shown]
[im 25/74  brain]
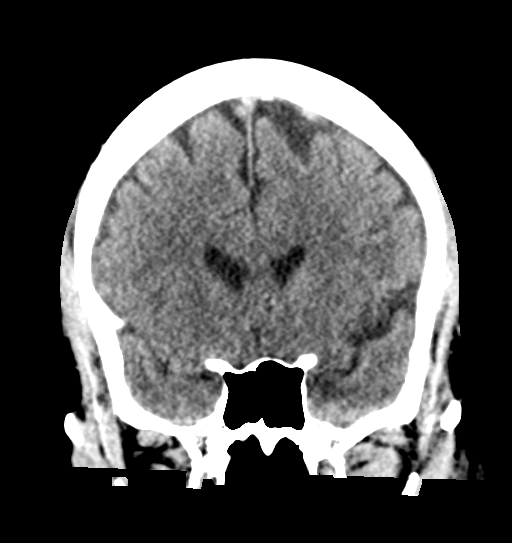
[im 33/74  brain]
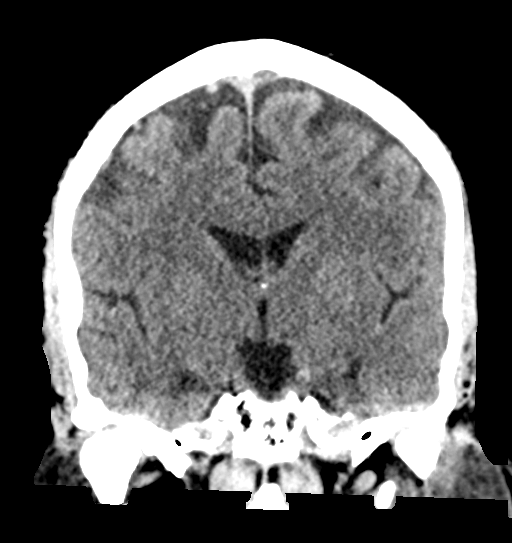
[im 41/74  brain]
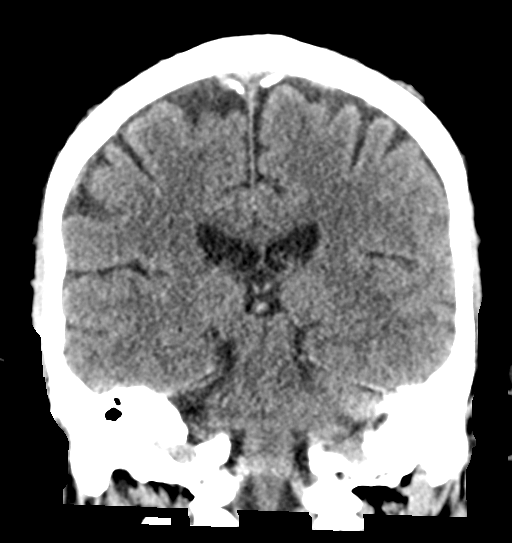

[Series 6: head without sag · sagittal · non-contrast · 0.36mm/px · 3 of 58 slices shown]
[im 20/58  brain]
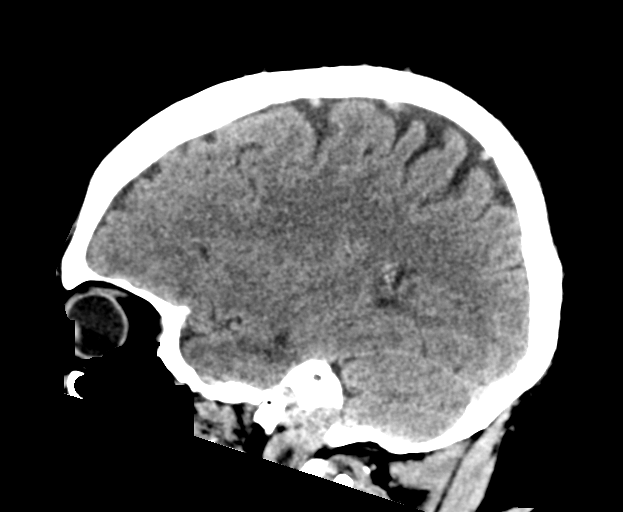
[im 29/58  brain]
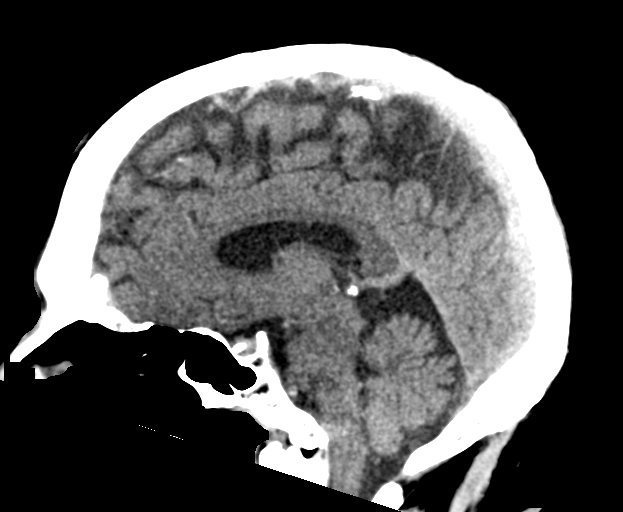
[im 39/58  brain]
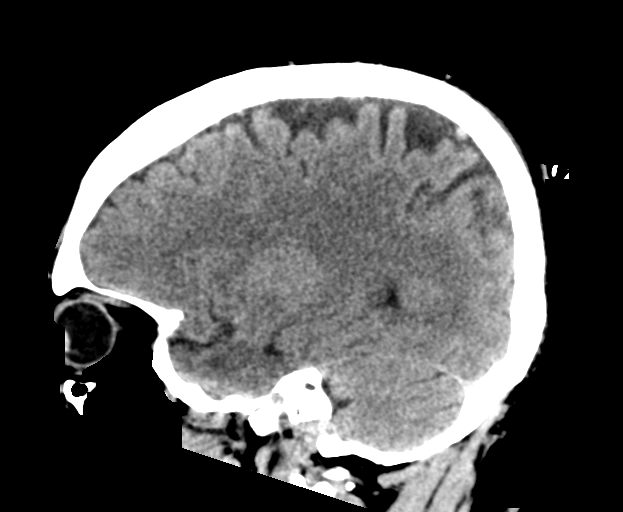

[16 of 47 positions shown; findings below may reference images not displayed]

FINDINGS: Brain: Cerebral volume remains normal for age. No midline shift,
ventriculomegaly, mass effect, evidence of mass lesion, intracranial
hemorrhage or evidence of cortically based acute infarction.
Gray-white matter differentiation is stable and within normal limits
for age. No convincing encephalomalacia.

Vascular: Calcified atherosclerosis at the skull base. No suspicious
intracranial vascular hyperdensity.

Skull: No acute osseous abnormality identified.

Sinuses/Orbits: Visualized paranasal sinuses and mastoids are stable
and well aerated. Bilateral tympanic cavities are clear. There is
debris in the bilateral external auditory canals.

Other: Visualized orbits and scalp soft tissues are within normal
limits.
IMPRESSION: Normal for age non contrast CT appearance of the brain, stable since

## 2022-08-08 IMAGING — DX DG SHOULDER 2+V*R*
3 series · 3 of 3 positions shown · non-contrast
Comparison: None.

CLINICAL DATA: Shoulder pain

EXAM:
RIGHT SHOULDER - 2+ VIEW

[shoulder grashey]
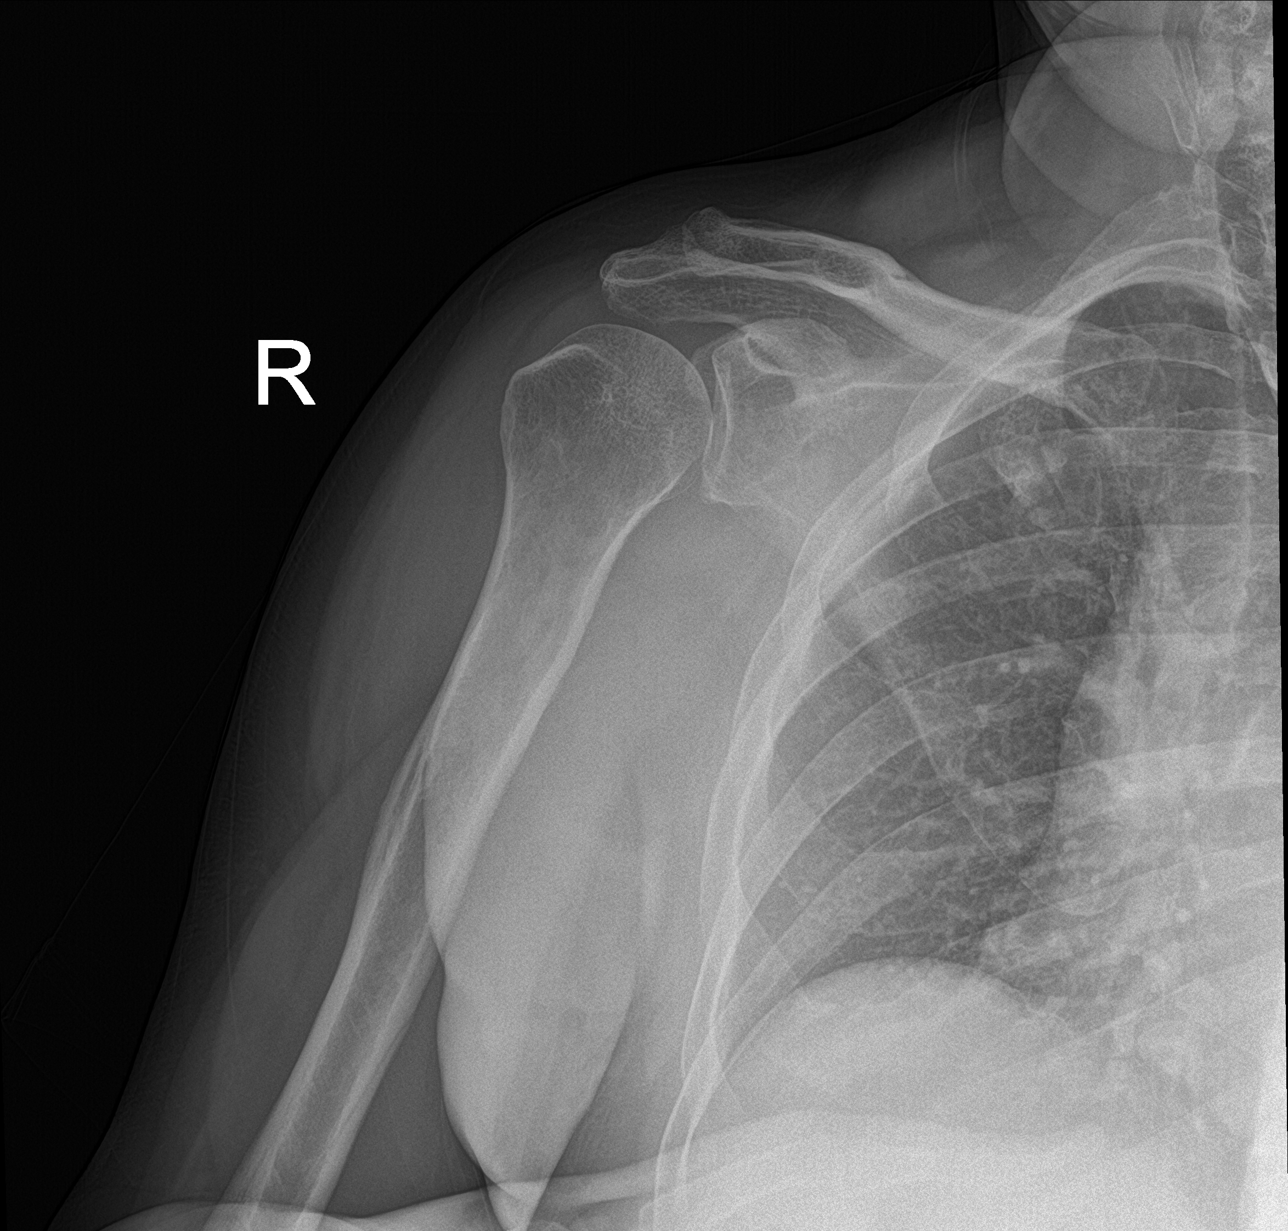

[shoulder y-view]
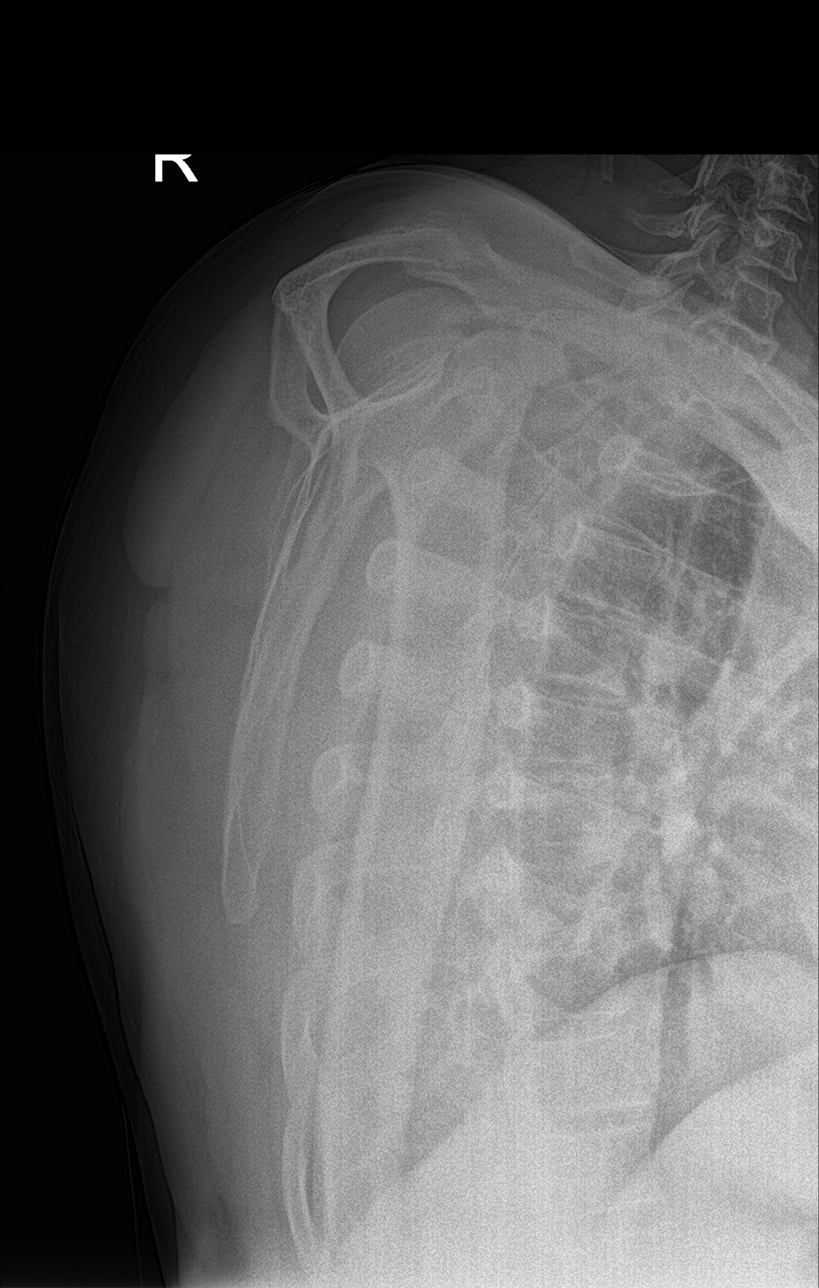

[shoulder axial]
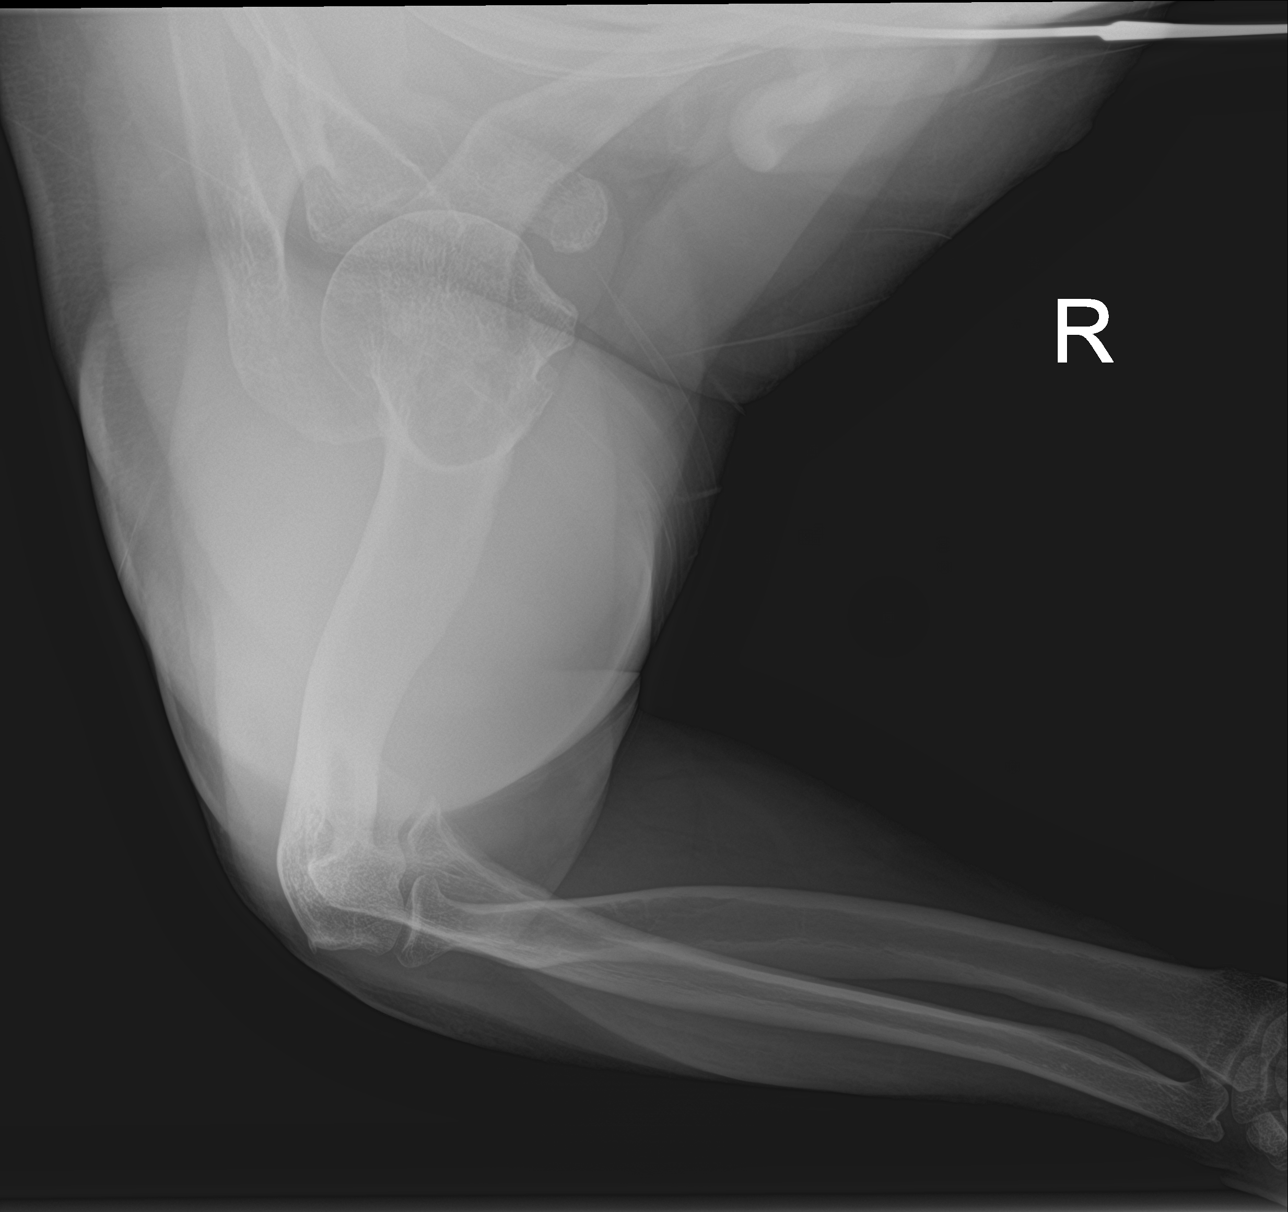

[3 of 3 positions shown; findings below may reference images not displayed]

FINDINGS: No fracture or malalignment. Mild glenohumeral degenerative change.
Mild AC joint degenerative change.
IMPRESSION: No acute osseous abnormality

## 2022-10-02 ENCOUNTER — Encounter: Payer: Self-pay | Admitting: *Deleted

## 2022-11-15 ENCOUNTER — Other Ambulatory Visit: Payer: Self-pay | Admitting: Internal Medicine

## 2022-11-16 LAB — COMPLETE METABOLIC PANEL WITH GFR
AG Ratio: 1.3 (calc) (ref 1.0–2.5)
ALT: 36 U/L (ref 9–46)
AST: 20 U/L (ref 10–35)
Albumin: 4.2 g/dL (ref 3.6–5.1)
Alkaline phosphatase (APISO): 113 U/L (ref 35–144)
BUN: 19 mg/dL (ref 7–25)
CO2: 24 mmol/L (ref 20–32)
Calcium: 10.3 mg/dL (ref 8.6–10.3)
Chloride: 101 mmol/L (ref 98–110)
Creat: 1.1 mg/dL (ref 0.70–1.35)
Globulin: 3.3 g/dL (calc) (ref 1.9–3.7)
Glucose, Bld: 134 mg/dL — ABNORMAL HIGH (ref 65–99)
Potassium: 3.8 mmol/L (ref 3.5–5.3)
Sodium: 138 mmol/L (ref 135–146)
Total Bilirubin: 0.5 mg/dL (ref 0.2–1.2)
Total Protein: 7.5 g/dL (ref 6.1–8.1)
eGFR: 76 mL/min/{1.73_m2} (ref >=60)

## 2022-11-16 LAB — CBC
HCT: 48.3 % (ref 38.5–50.0)
Hemoglobin: 16.6 g/dL (ref 13.2–17.1)
MCH: 29.6 pg (ref 27.0–33.0)
MCHC: 34.4 g/dL (ref 32.0–36.0)
MCV: 86.1 fL (ref 80.0–100.0)
MPV: 10.3 fL (ref 7.5–12.5)
Platelets: 319 10*3/uL (ref 140–400)
RBC: 5.61 10*6/uL (ref 4.20–5.80)
RDW: 12.8 % (ref 11.0–15.0)
WBC: 8.1 10*3/uL (ref 3.8–10.8)

## 2022-11-16 LAB — VITAMIN D 25 HYDROXY (VIT D DEFICIENCY, FRACTURES): Vit D, 25-Hydroxy: 20 ng/mL — ABNORMAL LOW (ref 30–100)

## 2022-11-16 LAB — LIPID PANEL
Cholesterol: 228 mg/dL — ABNORMAL HIGH (ref <200)
HDL: 46 mg/dL (ref >=40)
LDL Cholesterol (Calc): 138 mg/dL (calc) — ABNORMAL HIGH
Non-HDL Cholesterol (Calc): 182 mg/dL (calc) — ABNORMAL HIGH (ref <130)
Total CHOL/HDL Ratio: 5 (calc) — ABNORMAL HIGH (ref <5.0)
Triglycerides: 283 mg/dL — ABNORMAL HIGH (ref <150)

## 2022-11-16 LAB — TSH: TSH: 3.73 mIU/L (ref 0.40–4.50)

## 2022-11-16 LAB — PSA: PSA: 5.39 ng/mL — ABNORMAL HIGH (ref <=4.00)

## 2022-12-02 ENCOUNTER — Other Ambulatory Visit: Payer: Self-pay | Admitting: Student

## 2022-12-02 DIAGNOSIS — I10 Essential (primary) hypertension: Secondary | ICD-10-CM

## 2022-12-04 NOTE — Telephone Encounter (Signed)
LOV was  12/06/2021.  Called pt to schedule an appt - no answer and mailbox is full, unable to leave a message.

## 2022-12-28 ENCOUNTER — Encounter: Payer: Self-pay | Admitting: Internal Medicine

## 2023-01-15 ENCOUNTER — Encounter: Payer: Self-pay | Admitting: Gastroenterology

## 2023-01-31 ENCOUNTER — Ambulatory Visit: Payer: No Typology Code available for payment source | Admitting: Gastroenterology

## 2023-10-05 ENCOUNTER — Encounter: Payer: Self-pay | Admitting: *Deleted
